# Patient Record
Sex: Male | Born: 1945 | Race: White | Hispanic: No | Marital: Married | Smoking: Former smoker
Health system: Southern US, Community
[De-identification: ages and names within clinical notes are randomized; demographics above are authoritative.]

## PROBLEM LIST (undated history)

## (undated) DIAGNOSIS — I2510 Atherosclerotic heart disease of native coronary artery without angina pectoris: Secondary | ICD-10-CM

## (undated) DIAGNOSIS — E7800 Pure hypercholesterolemia, unspecified: Secondary | ICD-10-CM

## (undated) DIAGNOSIS — R42 Dizziness and giddiness: Secondary | ICD-10-CM

## (undated) DIAGNOSIS — I214 Non-ST elevation (NSTEMI) myocardial infarction: Secondary | ICD-10-CM

## (undated) DIAGNOSIS — C4490 Unspecified malignant neoplasm of skin, unspecified: Secondary | ICD-10-CM

## (undated) DIAGNOSIS — I10 Essential (primary) hypertension: Secondary | ICD-10-CM

## (undated) DIAGNOSIS — G8929 Other chronic pain: Secondary | ICD-10-CM

## (undated) DIAGNOSIS — M545 Low back pain, unspecified: Secondary | ICD-10-CM

## (undated) DIAGNOSIS — E119 Type 2 diabetes mellitus without complications: Secondary | ICD-10-CM

## (undated) DIAGNOSIS — M1990 Unspecified osteoarthritis, unspecified site: Secondary | ICD-10-CM

## (undated) DIAGNOSIS — J189 Pneumonia, unspecified organism: Secondary | ICD-10-CM

## (undated) HISTORY — PX: TONSILLECTOMY: SUR1361

## (undated) HISTORY — PX: BACK SURGERY: SHX140

## (undated) HISTORY — PX: COSMETIC SURGERY: SHX468

## (undated) HISTORY — PX: CORONARY ANGIOPLASTY WITH STENT PLACEMENT: SHX49

## (undated) HISTORY — PX: CATARACT EXTRACTION: SUR2

## (undated) HISTORY — PX: FRACTURE SURGERY: SHX138

## (undated) HISTORY — PX: APPENDECTOMY: SHX54

---

## 1979-09-19 HISTORY — PX: FOREARM FRACTURE SURGERY: SHX649

## 2007-08-19 DIAGNOSIS — I214 Non-ST elevation (NSTEMI) myocardial infarction: Secondary | ICD-10-CM

## 2007-08-19 HISTORY — DX: Non-ST elevation (NSTEMI) myocardial infarction: I21.4

## 2009-09-18 HISTORY — PX: PARATHYROIDECTOMY: SHX19

## 2009-09-18 HISTORY — PX: CARDIAC CATHETERIZATION: SHX172

## 2012-09-18 DIAGNOSIS — J189 Pneumonia, unspecified organism: Secondary | ICD-10-CM

## 2012-09-18 HISTORY — DX: Pneumonia, unspecified organism: J18.9

## 2013-09-21 DIAGNOSIS — J180 Bronchopneumonia, unspecified organism: Secondary | ICD-10-CM | POA: Diagnosis not present

## 2015-04-19 ENCOUNTER — Inpatient Hospital Stay (HOSPITAL_COMMUNITY)
Admission: EM | Admit: 2015-04-19 | Discharge: 2015-04-20 | DRG: 247 | Disposition: A | Discharge status: Home / Self Care | Payer: Medicare Other | Attending: Cardiovascular Disease | Admitting: Cardiovascular Disease

## 2015-04-19 ENCOUNTER — Emergency Department (HOSPITAL_COMMUNITY): Payer: Medicare Other

## 2015-04-19 ENCOUNTER — Encounter (HOSPITAL_COMMUNITY): Payer: Self-pay

## 2015-04-19 ENCOUNTER — Encounter (HOSPITAL_COMMUNITY)
Admission: EM | Disposition: A | Discharge status: Home / Self Care | Payer: Medicare Other | Source: Home / Self Care | Attending: Cardiovascular Disease

## 2015-04-19 DIAGNOSIS — E119 Type 2 diabetes mellitus without complications: Secondary | ICD-10-CM | POA: Diagnosis present

## 2015-04-19 DIAGNOSIS — I10 Essential (primary) hypertension: Secondary | ICD-10-CM | POA: Diagnosis present

## 2015-04-19 DIAGNOSIS — I2510 Atherosclerotic heart disease of native coronary artery without angina pectoris: Secondary | ICD-10-CM | POA: Diagnosis not present

## 2015-04-19 DIAGNOSIS — E785 Hyperlipidemia, unspecified: Secondary | ICD-10-CM | POA: Diagnosis present

## 2015-04-19 DIAGNOSIS — I214 Non-ST elevation (NSTEMI) myocardial infarction: Secondary | ICD-10-CM | POA: Diagnosis not present

## 2015-04-19 DIAGNOSIS — Z7982 Long term (current) use of aspirin: Secondary | ICD-10-CM

## 2015-04-19 DIAGNOSIS — I252 Old myocardial infarction: Secondary | ICD-10-CM

## 2015-04-19 DIAGNOSIS — Z955 Presence of coronary angioplasty implant and graft: Secondary | ICD-10-CM

## 2015-04-19 DIAGNOSIS — I25110 Atherosclerotic heart disease of native coronary artery with unstable angina pectoris: Secondary | ICD-10-CM | POA: Diagnosis present

## 2015-04-19 DIAGNOSIS — T82858A Stenosis of vascular prosthetic devices, implants and grafts, initial encounter: Secondary | ICD-10-CM | POA: Diagnosis present

## 2015-04-19 DIAGNOSIS — R079 Chest pain, unspecified: Secondary | ICD-10-CM

## 2015-04-19 DIAGNOSIS — Z87891 Personal history of nicotine dependence: Secondary | ICD-10-CM | POA: Diagnosis not present

## 2015-04-19 DIAGNOSIS — R0789 Other chest pain: Secondary | ICD-10-CM | POA: Diagnosis not present

## 2015-04-19 HISTORY — DX: Non-ST elevation (NSTEMI) myocardial infarction: I21.4

## 2015-04-19 HISTORY — DX: Essential (primary) hypertension: I10

## 2015-04-19 HISTORY — DX: Atherosclerotic heart disease of native coronary artery without angina pectoris: I25.10

## 2015-04-19 HISTORY — DX: Low back pain: M54.5

## 2015-04-19 HISTORY — DX: Unspecified osteoarthritis, unspecified site: M19.90

## 2015-04-19 HISTORY — DX: Dizziness and giddiness: R42

## 2015-04-19 HISTORY — DX: Pneumonia, unspecified organism: J18.9

## 2015-04-19 HISTORY — DX: Type 2 diabetes mellitus without complications: E11.9

## 2015-04-19 HISTORY — DX: Unspecified malignant neoplasm of skin, unspecified: C44.90

## 2015-04-19 HISTORY — DX: Pure hypercholesterolemia, unspecified: E78.00

## 2015-04-19 HISTORY — DX: Low back pain, unspecified: M54.50

## 2015-04-19 HISTORY — DX: Other chronic pain: G89.29

## 2015-04-19 HISTORY — PX: CARDIAC CATHETERIZATION: SHX172

## 2015-04-19 LAB — BASIC METABOLIC PANEL
Anion gap: 9 (ref 5–15)
BUN: 20 mg/dL (ref 6–20)
CO2: 28 mmol/L (ref 22–32)
Calcium: 9 mg/dL (ref 8.9–10.3)
Chloride: 103 mmol/L (ref 101–111)
Creatinine, Ser: 1.15 mg/dL (ref 0.61–1.24)
GFR calc Af Amer: >60 mL/min (ref >60)
GFR calc non Af Amer: >60 mL/min (ref >60)
Glucose, Bld: 120 mg/dL — ABNORMAL HIGH (ref 65–99)
POTASSIUM: 3.7 mmol/L (ref 3.5–5.1)
SODIUM: 140 mmol/L (ref 135–145)

## 2015-04-19 LAB — CBC
HCT: 38.9 % — ABNORMAL LOW (ref 39.0–52.0)
Hemoglobin: 13 g/dL (ref 13.0–17.0)
MCH: 30.8 pg (ref 26.0–34.0)
MCHC: 33.4 g/dL (ref 30.0–36.0)
MCV: 92.2 fL (ref 78.0–100.0)
Platelets: 209 10*3/uL (ref 150–400)
RBC: 4.22 MIL/uL (ref 4.22–5.81)
RDW: 13.5 % (ref 11.5–15.5)
WBC: 6.2 10*3/uL (ref 4.0–10.5)

## 2015-04-19 LAB — TROPONIN I
Troponin I: 1.43 ng/mL (ref <0.031)
Troponin I: 2.32 ng/mL (ref <0.031)

## 2015-04-19 LAB — GLUCOSE, CAPILLARY
Glucose-Capillary: 99 mg/dL (ref 65–99)
Glucose-Capillary: 147 mg/dL — ABNORMAL HIGH (ref 65–99)

## 2015-04-19 LAB — PROTIME-INR
INR: 1 (ref 0.00–1.49)
Prothrombin Time: 13.4 seconds (ref 11.6–15.2)

## 2015-04-19 LAB — I-STAT TROPONIN, ED: Troponin i, poc: 0.51 ng/mL (ref 0.00–0.08)

## 2015-04-19 SURGERY — LEFT HEART CATH AND CORONARY ANGIOGRAPHY
Anesthesia: LOCAL

## 2015-04-19 MED ORDER — NITROGLYCERIN 2 % TD OINT
1.0000 [in_us] | TOPICAL_OINTMENT | Freq: Four times a day (QID) | TRANSDERMAL | Status: DC
  Administered 2015-04-19: 1 [in_us] via TOPICAL
  Filled 2015-04-19: qty 1

## 2015-04-19 MED ORDER — FENTANYL CITRATE (PF) 100 MCG/2ML IJ SOLN
INTRAMUSCULAR | Status: AC
  Filled 2015-04-19: qty 4

## 2015-04-19 MED ORDER — SODIUM CHLORIDE 0.9 % WEIGHT BASED INFUSION: 3.0000 mL/kg/h | INTRAVENOUS | Status: AC

## 2015-04-19 MED ORDER — GABAPENTIN 600 MG PO TABS
600.0000 mg | ORAL_TABLET | Freq: Three times a day (TID) | ORAL | Status: DC
  Administered 2015-04-19 – 2015-04-20 (×3): 600 mg via ORAL
  Filled 2015-04-19 (×6): qty 1

## 2015-04-19 MED ORDER — SODIUM CHLORIDE 0.9 % IV SOLN
250.0000 mL | INTRAVENOUS | Status: DC | PRN
Start: 2015-04-19 — End: 2015-04-20

## 2015-04-19 MED ORDER — LIDOCAINE HCL (PF) 1 % IJ SOLN
INTRAMUSCULAR | Status: AC
  Filled 2015-04-19: qty 30

## 2015-04-19 MED ORDER — LIDOCAINE HCL (PF) 1 % IJ SOLN
INTRAMUSCULAR | Status: DC | PRN
  Administered 2015-04-19: 30 mL via INTRADERMAL

## 2015-04-19 MED ORDER — SODIUM CHLORIDE 0.9 % IJ SOLN: 3.0000 mL | INTRAMUSCULAR | Status: DC | PRN

## 2015-04-19 MED ORDER — SODIUM CHLORIDE 0.9 % IJ SOLN
3.0000 mL | Freq: Two times a day (BID) | INTRAMUSCULAR | Status: DC
Start: 2015-04-19 — End: 2015-04-19

## 2015-04-19 MED ORDER — NITROGLYCERIN 1 MG/10 ML FOR IR/CATH LAB
INTRA_ARTERIAL | Status: DC | PRN
  Administered 2015-04-19: 200 ug via INTRACORONARY

## 2015-04-19 MED ORDER — HYDRALAZINE HCL 20 MG/ML IJ SOLN
10.0000 mg | Freq: Four times a day (QID) | INTRAMUSCULAR | Status: DC | PRN
  Administered 2015-04-19: 10 mg via INTRAVENOUS
  Filled 2015-04-19: qty 1

## 2015-04-19 MED ORDER — FENTANYL CITRATE (PF) 100 MCG/2ML IJ SOLN
INTRAMUSCULAR | Status: DC | PRN
  Administered 2015-04-19 (×2): 25 ug via INTRAVENOUS

## 2015-04-19 MED ORDER — MIDAZOLAM HCL 2 MG/2ML IJ SOLN
INTRAMUSCULAR | Status: AC
  Filled 2015-04-19: qty 4

## 2015-04-19 MED ORDER — SODIUM CHLORIDE 0.9 % IJ SOLN: 3.0000 mL | Freq: Two times a day (BID) | INTRAMUSCULAR | Status: DC

## 2015-04-19 MED ORDER — LOSARTAN POTASSIUM 50 MG PO TABS: 100.0000 mg | ORAL_TABLET | Freq: Every day | ORAL | Status: DC

## 2015-04-19 MED ORDER — FISH OIL 1000 MG PO CAPS: 1.0000 | ORAL_CAPSULE | Freq: Every day | ORAL | Status: DC

## 2015-04-19 MED ORDER — MORPHINE SULFATE 2 MG/ML IJ SOLN
2.0000 mg | INTRAMUSCULAR | Status: DC | PRN
  Administered 2015-04-19 (×4): 2 mg via INTRAVENOUS
  Filled 2015-04-19 (×4): qty 1

## 2015-04-19 MED ORDER — TICAGRELOR 90 MG PO TABS
ORAL_TABLET | ORAL | Status: DC | PRN
  Administered 2015-04-19: 180 mg via ORAL

## 2015-04-19 MED ORDER — AMLODIPINE BESYLATE 5 MG PO TABS
5.0000 mg | ORAL_TABLET | Freq: Every day | ORAL | Status: DC
  Administered 2015-04-19 – 2015-04-20 (×2): 5 mg via ORAL
  Filled 2015-04-19 (×2): qty 1

## 2015-04-19 MED ORDER — HEPARIN BOLUS VIA INFUSION
4000.0000 [IU] | Freq: Once | INTRAVENOUS | Status: AC
  Administered 2015-04-19: 4000 [IU] via INTRAVENOUS
  Filled 2015-04-19: qty 4000

## 2015-04-19 MED ORDER — ENSURE ENLIVE PO LIQD
237.0000 mL | Freq: Two times a day (BID) | ORAL | Status: DC
  Administered 2015-04-19: 237 mL via ORAL
  Filled 2015-04-19 (×5): qty 237

## 2015-04-19 MED ORDER — SODIUM CHLORIDE 0.9 % IV SOLN: 250.0000 mL | INTRAVENOUS | Status: DC | PRN

## 2015-04-19 MED ORDER — ROSUVASTATIN CALCIUM 20 MG PO TABS: 20.0000 mg | ORAL_TABLET | Freq: Every evening | ORAL | Status: DC

## 2015-04-19 MED ORDER — MORPHINE SULFATE 4 MG/ML IJ SOLN
4.0000 mg | Freq: Once | INTRAMUSCULAR | Status: AC
  Administered 2015-04-19: 17:00:00 4 mg via INTRAVENOUS
  Filled 2015-04-19: qty 1

## 2015-04-19 MED ORDER — NITROGLYCERIN 0.4 MG/SPRAY TL SOLN
Status: AC
  Filled 2015-04-19: qty 4.9

## 2015-04-19 MED ORDER — ATORVASTATIN CALCIUM 80 MG PO TABS
80.0000 mg | ORAL_TABLET | Freq: Every day | ORAL | Status: DC
  Administered 2015-04-19: 19:00:00 80 mg via ORAL
  Filled 2015-04-19 (×3): qty 1

## 2015-04-19 MED ORDER — HEPARIN (PORCINE) IN NACL 2-0.9 UNIT/ML-% IJ SOLN
INTRAMUSCULAR | Status: AC
  Filled 2015-04-19: qty 1500

## 2015-04-19 MED ORDER — TICAGRELOR 90 MG PO TABS
90.0000 mg | ORAL_TABLET | Freq: Two times a day (BID) | ORAL | Status: DC
  Administered 2015-04-19 – 2015-04-20 (×2): 90 mg via ORAL
  Filled 2015-04-19 (×3): qty 1

## 2015-04-19 MED ORDER — BIVALIRUDIN BOLUS VIA INFUSION - CUPID
INTRAVENOUS | Status: DC | PRN
  Administered 2015-04-19: 60.525 mg via INTRAVENOUS

## 2015-04-19 MED ORDER — NITROGLYCERIN 0.4 MG SL SUBL: 0.4000 mg | SUBLINGUAL_TABLET | SUBLINGUAL | Status: DC | PRN

## 2015-04-19 MED ORDER — ACETAMINOPHEN 325 MG PO TABS: 650.0000 mg | ORAL_TABLET | ORAL | Status: DC | PRN

## 2015-04-19 MED ORDER — SODIUM CHLORIDE 0.9 % IV SOLN: INTRAVENOUS | Status: DC

## 2015-04-19 MED ORDER — ASPIRIN 81 MG PO CHEW
81.0000 mg | CHEWABLE_TABLET | Freq: Every day | ORAL | Status: DC
  Administered 2015-04-20: 10:00:00 81 mg via ORAL
  Filled 2015-04-19: qty 1

## 2015-04-19 MED ORDER — METOPROLOL TARTRATE 25 MG PO TABS
25.0000 mg | ORAL_TABLET | Freq: Every day | ORAL | Status: DC
  Administered 2015-04-19 – 2015-04-20 (×2): 25 mg via ORAL
  Filled 2015-04-19 (×2): qty 1

## 2015-04-19 MED ORDER — SODIUM CHLORIDE 0.9 % IV SOLN
1.0000 mg/kg/h | INTRAVENOUS | Status: AC
  Filled 2015-04-19: qty 250

## 2015-04-19 MED ORDER — NITROGLYCERIN 0.4 MG/SPRAY TL SOLN
Status: DC | PRN
  Administered 2015-04-19: 1 via SUBLINGUAL

## 2015-04-19 MED ORDER — MIDAZOLAM HCL 2 MG/2ML IJ SOLN
INTRAMUSCULAR | Status: DC | PRN
  Administered 2015-04-19: 1 mg via INTRAVENOUS

## 2015-04-19 MED ORDER — ANGIOPLASTY BOOK
Freq: Once | Status: AC
  Administered 2015-04-19: 20:00:00
  Filled 2015-04-19: qty 1

## 2015-04-19 MED ORDER — NITROGLYCERIN 1 MG/10 ML FOR IR/CATH LAB
INTRA_ARTERIAL | Status: AC
  Filled 2015-04-19: qty 10

## 2015-04-19 MED ORDER — TICAGRELOR 90 MG PO TABS
ORAL_TABLET | ORAL | Status: AC
  Filled 2015-04-19: qty 2

## 2015-04-19 MED ORDER — BIVALIRUDIN 250 MG IV SOLR
INTRAVENOUS | Status: AC
  Filled 2015-04-19: qty 250

## 2015-04-19 MED ORDER — ASPIRIN 81 MG PO CHEW: 81.0000 mg | CHEWABLE_TABLET | ORAL | Status: DC

## 2015-04-19 MED ORDER — ACETAMINOPHEN 500 MG PO TABS: 500.0000 mg | ORAL_TABLET | Freq: Two times a day (BID) | ORAL | Status: DC | PRN

## 2015-04-19 MED ORDER — BIVALIRUDIN 250 MG IV SOLR
250.0000 mg | INTRAVENOUS | Status: DC | PRN
  Administered 2015-04-19 (×2): 1.75 mg/kg/h via INTRAVENOUS

## 2015-04-19 MED ORDER — LIDOCAINE HCL (PF) 1 % IJ SOLN
INTRAMUSCULAR | Status: DC | PRN
  Administered 2015-04-19: 12:00:00

## 2015-04-19 MED ORDER — SODIUM CHLORIDE 0.9 % IV SOLN
1.7500 mg/kg/h | INTRAVENOUS | Status: DC
  Filled 2015-04-19: qty 250

## 2015-04-19 MED ORDER — HEPARIN (PORCINE) IN NACL 100-0.45 UNIT/ML-% IJ SOLN
950.0000 [IU]/h | INTRAMUSCULAR | Status: DC
  Administered 2015-04-19: 950 [IU]/h via INTRAVENOUS
  Filled 2015-04-19 (×2): qty 250

## 2015-04-19 MED ORDER — HEPARIN (PORCINE) IN NACL 100-0.45 UNIT/ML-% IJ SOLN
INTRAMUSCULAR | Status: DC | PRN
  Administered 2015-04-19: 900 [IU]/h via INTRAVENOUS

## 2015-04-19 MED ORDER — ONDANSETRON HCL 4 MG/2ML IJ SOLN
4.0000 mg | Freq: Four times a day (QID) | INTRAMUSCULAR | Status: DC | PRN
  Administered 2015-04-19: 17:00:00 4 mg via INTRAVENOUS
  Filled 2015-04-19: qty 2

## 2015-04-19 SURGICAL SUPPLY — 19 items
BALLN EUPHORA RX 2.0X12 (BALLOONS) ×3
BALLN ~~LOC~~ TREK RX 3.25X20 (BALLOONS) ×3 IMPLANT
BALLOON EUPHORA RX 2.0X12 (BALLOONS) ×2 IMPLANT
CATH INFINITI 5FR MULTPACK ANG (CATHETERS) ×3 IMPLANT
CATH OPTITORQUE TIG 4.0 5F (CATHETERS) IMPLANT
CATH VISTA GUIDE 6FR JR4 (CATHETERS) ×3 IMPLANT
GLIDESHEATH SLEND A-KIT 6F 22G (SHEATH) IMPLANT
KIT ENCORE 26 ADVANTAGE (KITS) ×3 IMPLANT
KIT HEART LEFT (KITS) ×3 IMPLANT
PACK CARDIAC CATHETERIZATION (CUSTOM PROCEDURE TRAY) ×3 IMPLANT
SHEATH PINNACLE 5F 10CM (SHEATH) ×3 IMPLANT
SHEATH PINNACLE 6F 10CM (SHEATH) ×3 IMPLANT
STENT XIENCE ALPINE RX 2.75X33 (Permanent Stent) ×3 IMPLANT
STENT XIENCE ALPINE RX 3.0X15 (Permanent Stent) ×3 IMPLANT
SYR MEDRAD MARK V 150ML (SYRINGE) ×3 IMPLANT
TRANSDUCER W/STOPCOCK (MISCELLANEOUS) ×3 IMPLANT
WIRE ASAHI PROWATER 180CM (WIRE) ×3 IMPLANT
WIRE EMERALD 3MM-J .035X150CM (WIRE) ×3 IMPLANT
WIRE HI TORQ VERSACORE-J 145CM (WIRE) IMPLANT

## 2015-04-19 NOTE — Progress Notes (Addendum)
UR COMPLETED  

## 2015-04-19 NOTE — Progress Notes (Signed)
CRITICAL VALUE ALERT  Critical value received: Troponin I  Date of notification:  04/19/2015  Time of notification:  7893  Critical value read back:Yes.    Nurse who received alert:  Dina Rich RN  MD notified (1st page): Almyra Deforest PA  Time of first page: 1500  Responding MD: Almyra Deforest   Time MD responded: [phone]

## 2015-04-19 NOTE — Progress Notes (Signed)
Patient arrived from cath lab approx 1300 with tender right groin site and mild ooze from site.  Pain steadily increased over the next hour and small (6 cm) hematoma noted distal to site.  Dr. Ellyn Hack called and examined patient.  Pain managed with morphine throughout the afternoon and for sheath pull.  Patient resting comfortably at this time with no pain except upon palp.  Hematoma has resolved.

## 2015-04-19 NOTE — Progress Notes (Signed)
ANTICOAGULATION CONSULT NOTE - Initial Consult  Pharmacy Consult for heparin Indication: chest pain/ACS  Allergies  Allergen Reactions  . Penicillins     Patient Measurements: Height: 5\' 8"  (172.7 cm) Weight: 178 lb (80.74 kg) IBW/kg (Calculated) : 68.4 Heparin Dosing Weight: 80.7kg  Vital Signs: Temp: 98.1 F (36.7 C) (08/01 0729) Temp Source: Oral (08/01 0729) BP: 154/81 mmHg (08/01 0915) Pulse Rate: 78 (08/01 0915)  Labs:  Recent Labs  04/19/15 0825  HGB 13.0  HCT 38.9*  PLT 209  CREATININE 1.15    Estimated Creatinine Clearance: 60.3 mL/min (by C-G formula based on Cr of 1.15).   Medical History: Past Medical History  Diagnosis Date  . Diabetes mellitus without complication   . NSTEMI (non-ST elevated myocardial infarction)   . Hypertension   . High cholesterol     Assessment: 82 yom with PMH of NSTEMI and coronary stent replacement to the ED with CP. Pharmacy consulted to dose heparin for ACS/CP (no anticoag pta) and cardiology consulted to evaluate for possible cath. CBC wnl on admit. No bleeding documented.  Goal of Therapy:  Heparin level 0.3-0.7 units/ml Monitor platelets by anticoagulation protocol: Yes   Plan:  Heparin 4000unit bolus Heparin IV @ 950 units/h 6h HL Daily HL/CBC Mon s/sx bleeding  Elicia Lamp, PharmD Clinical Pharmacist Pager [phone] 04/19/2015 9:27 AM

## 2015-04-19 NOTE — H&P (Signed)
Patient ID: Trevor Hood MRN: [id number], DOB/AGE: 24-Oct-1945   Admit date: 04/19/2015   Primary Physician: No primary care provider on file. Primary Cardiologist: New (Dr. Harrington Challenger)  Pt. Profile:  69 year old male with known history of CAD status post prior non-STEMI in December 2008 resulting in PCI + drug-eluting stenting to the distal RCA 2 followed by drug-eluting stent PCI to the circumflex in January 2009. Also with multiple other cardiac risk factors including hypertension, hyperlipidemia and diabetes, presenting with recurrent chest pain/non-STEMI.  Problem List  Past Medical History  Diagnosis Date  . Diabetes mellitus without complication   . NSTEMI (non-ST elevated myocardial infarction)   . Hypertension   . High cholesterol     Past Surgical History  Procedure Laterality Date  . Coronary stent placement      DES x2 to distal RCA 2008; DES to circ 2009     Allergies  Allergies  Allergen Reactions  . Penicillins     HPI The patient is a 69 year old retired male, who presents to the [facility] ED with complaints of chest pain.  He has a known history of CAD. In December 2008 he had a non-STEMI secondary to distal occlusion of the RCA. He underwent PCI plus drug-eluting stenting 2 to the RCA. In January 2009 he underwent PCI of the circumflex also with a drug-eluting stent. All 3 stents were [place] in [place]. He had a repeat heart catheterization in 2011 performed in [place] that showed patent stents. He also has a history of treated hypertension and hyperlipidemia. He has a history of diabetes that is diet controlled. He notes a remote history of tobacco abuse, and quit in 1985. He also reports a history of bilateral carotid artery disease with 100% occlusion of the right and at least 50% on the left. This has been followed by the [facility] in [place].  He reports full medication compliance with all of his prescribed meds.  He presented to the [facility] ED earlier today after developing nocturnal chest discomfort awakening him from his sleep. Symptoms began at approximately 3 AM this morning. He noted left sided arm pain radiating to his sternum. The discomfort felt pressure-like. He denies any associated dyspnea. His chest pain is very similar to his previous angina. He took sublingual nitroglycerin at home with some relief and his wife called EMS. He was transported to the ED for further evaluation. In route, he was given additional sublingual nitroglycerin and a nitroglycerin patch was placed. EKG shows NSR w/o ischemia. No baseline EKGs for comparison. POC troponin in ED elevated at 0.51. CXR reviewed. Minimal infiltrate in RUL cannot be excluded, otherwise no other abnormality. CBC and BMP unremarkable. IV heparin and IV nitro were both initiated in the ED. He is now chest pain-free.   His wife and one of his daughters, who is a Therapist, sports with [employer], are present by his bedside. He has 5  daughters. No sons. He is retired from [employer] and lived in multiple states including [place], [place] and now [place]. He also served in [employer].    Home Medications  Prior to Admission medications   Medication Sig Start Date End Date Taking? Authorizing Provider  acetaminophen (TYLENOL) 500 MG tablet Take 500 mg by mouth 2 (two) times daily as needed for moderate pain.   Yes Historical Provider, MD  amLODipine (NORVASC) 5 MG tablet Take 5 mg by mouth daily.   Yes Historical Provider, MD  aspirin 325 MG tablet Take 325 mg by mouth daily.   Yes Historical Provider, MD  gabapentin (NEURONTIN) 600 MG tablet Take 600 mg by mouth 3 (three) times daily.   Yes Historical Provider, MD  losartan (COZAAR) 100 MG tablet Take 100 mg by mouth daily.   Yes Historical Provider, MD  metoprolol (LOPRESSOR) 50 MG tablet Take 25 mg by mouth daily.   Yes Historical Provider, MD  nitroGLYCERIN (NITROSTAT) 0.4 MG SL tablet Place 0.4 mg under the tongue  every 5 (five) minutes as needed for chest pain.   Yes Historical Provider, MD  Omega-3 Fatty Acids (FISH OIL) 1000 MG CAPS Take 1 capsule by mouth daily.   Yes Historical Provider, MD  rosuvastatin (CRESTOR) 20 MG tablet Take 20 mg by mouth every evening.   Yes Historical Provider, MD  traMADol (ULTRAM) 50 MG tablet Take 50 mg by mouth 3 (three) times daily as needed.   Yes Historical Provider, MD    Family History  Family History  Problem Relation Age of Onset  . Hypertension Mother     Social History  History   Social History  . Marital Status: Married    Spouse Name: N/A  . Number of Children: N/A  . Years of Education: N/A   Occupational History  . Not on file.   Social History Main Topics  . Smoking status: Former Research scientist (life sciences)  . Smokeless tobacco: Not on file  . Alcohol Use: 0.6 oz/week    1 Glasses of wine per week  . Drug Use: Not on file  . Sexual Activity: Not on file   Other Topics Concern  . Not on file   Social History Narrative  . No narrative on file     Review of Systems General:  No chills, fever, night sweats or weight changes.  Cardiovascular:  No chest pain, dyspnea on exertion, edema, orthopnea, palpitations, paroxysmal nocturnal dyspnea. Dermatological: No rash, lesions/masses Respiratory: No cough, dyspnea Urologic: No hematuria, dysuria Abdominal:   No nausea, vomiting, diarrhea, bright red blood per rectum, melena, or hematemesis Neurologic:  No visual changes, wkns, changes in mental status. All other systems reviewed and are otherwise negative except as noted above.  Physical Exam  Blood pressure 157/81, pulse 75, temperature 98.1 F (36.7 C), temperature source Oral, resp. rate 18, height 5\' 8"  (1.727 m), weight 178 lb (80.74 kg), SpO2 95 %.  General: Pleasant, NAD Psych: Normal affect. Neuro: Alert and oriented X 3. Moves all extremities spontaneously. HEENT: Normal  Neck: Supple without bruits or JVD. Lungs:  Resp regular and  unlabored, CTA. Heart: RRR, 1/6 SM at the RUSB. Abdomen: Soft, non-tender, non-distended, BS + x 4.  Extremities: No clubbing, cyanosis or edema. DP/PT/Radials 2+ and equal bilaterally.  Labs  Troponin [facility] of Care Test)  Recent Labs  04/19/15 0833  TROPIPOC 0.51*   No results for input(s): CKTOTAL, CKMB, TROPONINI in the last 72 hours. Lab Results  Component Value Date   WBC 6.2 04/19/2015   HGB 13.0 04/19/2015   HCT 38.9* 04/19/2015   MCV 92.2 04/19/2015   PLT 209 04/19/2015     Recent Labs Lab 04/19/15 0825  NA 140  K 3.7  CL 103  CO2 28  BUN 20  CREATININE 1.15  CALCIUM 9.0  GLUCOSE 120*   No results found for: CHOL, HDL, LDLCALC, TRIG No results found for: DDIMER   Radiology/Studies  Dg Chest 2 View  04/19/2015   CLINICAL DATA:  Left chest pain.  EXAM: CHEST  2 VIEW  COMPARISON:  None.  FINDINGS: Mediastinum and hilar structures normal. Minimal infiltrate right upper lobe cannot be excluded. Mild bibasilar subsegmental atelectasis. Heart size normal. No pleural effusion or pneumothorax. No acute bony abnormality.  IMPRESSION: 1.  Minimal infiltrate right upper lobe cannot be excluded.  2.  Mild bibasilar subsegmental atelectasis.   Electronically Signed   By: Marcello Moores  Register   On: 04/19/2015 08:03    ECG  Sinus rhythm without ischemia  ASSESSMENT AND PLAN  1. CAD/unstable angina/non-STEMI: Patient has known coronary artery disease and prior history of MI status post drug-eluting stenting to his RCA and circumflex arteries in 2008 and 2009. He now presents with chest pain very concerning for unstable angina. Initial point of care troponin is elevated at 0.51. He is currently chest pain-free on IV heparin and IV nitroglycerin. He has been NPO since last night. Given his history, recurrent angina and abnormal point of care troponin, would recommend left heart catheterization. Renal function is normal. Will chheck a stat INR and plan for left heart cath today +/-  PCI. Continue his home Metroprolol and Crestor. Will hold losartan until after his cath. Check 2D echo. Will continue to cycle cardiac enzymes to assess trend.  2. HTN: moderately elevated in the 808U systolic. Continue BB.  3. Hyperlipidemia: Continue Crestor  4. Bilateral Carotid Artery Disease: reports 100% on the right and 50% stenosis on the left. Followed at [facility] in [place]   5. DM: pt reports that this is been diet-controlled. Will check a hemoglobin A1c.    SignedLyda Jester, PA-C 04/19/2015, 10:39 AM   Agree with note written by Ellen Henri  [facility]  Patient with unstable angina. He has known CAD status post stenting of his right and circumflex coronary arteries in the past with positive risk factors. His point-of-care enzymes were mildly elevated. His EKG showed no acute changes. He was placed on IV heparin. He presents now for diagnostic coronary arteriography to define his anatomy.  Quay Burow 04/19/2015 11:25 AM

## 2015-04-19 NOTE — Progress Notes (Signed)
Site area: right groin  Site Prior to Removal:  Level 0  Pressure Applied For 20 MINUTES    Minutes Beginning at 1640  Manual:   Yes.    Patient Status During Pull:  stable  Post Pull Groin Site:  Level 0  Post Pull Instructions Given:  Yes.    Post Pull Pulses Present:  Yes.    Dressing Applied:  Yes.    Comments:

## 2015-04-19 NOTE — ED Notes (Signed)
Pt reports left sided chest pain that radiates down left arm that began at 0300 this morning.  Pt took two nitro at 0300 and reports some relief.  When he woke up at 0600 the pain had returned.  Pt given 3 nitro and 324mg  of ASA by EMS PTA.  The pain is described as sharp and is reproduced with deep breathing and palpation.

## 2015-04-19 NOTE — ED Provider Notes (Addendum)
CSN: [id number]     Arrival date & time 04/19/15  0721 History   First MD Initiated Contact with Patient 04/19/15 [phone]     Chief Complaint  Patient presents with  . Chest Pain     (Consider location/radiation/quality/duration/timing/severity/associated sxs/prior Treatment) HPI Complains of anterior chest pain rating to left arm awakened from sleep 3 AM today. He treated himself with 2 sublingual nitroglycerins and aspirin 325 mg with relief .pain recurred 6:30 AM today. He called EMS. EMS administered an additional 2 sublingual nitroglycerin's. He is presently pain-free associated symptoms include shortness of breath. No nausea no sweatiness. Past Medical History  Diagnosis Date  . Diabetes mellitus without complication   . NSTEMI (non-ST elevated myocardial infarction)   . Hypertension   . High cholesterol    Past Surgical History  Procedure Laterality Date  . Coronary stent placement     History reviewed. No pertinent family history. History  Substance Use Topics  . Smoking status: Former Research scientist (life sciences)  . Smokeless tobacco: Not on file  . Alcohol Use: 0.6 oz/week    1 Glasses of wine per week   no illicit drug use  Review of Systems  Constitutional: Negative.   HENT: Negative.   Respiratory: Positive for shortness of breath.   Cardiovascular: Positive for chest pain.  Gastrointestinal: Negative.   Musculoskeletal: Negative.   Skin: Negative.   Neurological: Negative.   Psychiatric/Behavioral: Negative.   All other systems reviewed and are negative.     Allergies  Penicillins  Home Medications   Prior to Admission medications   Not on File   Ht 5\' 8"  (1.727 m)  Wt 178 lb (80.74 kg)  BMI 27.07 kg/m2  SpO2 97% Physical Exam  Constitutional: He appears well-developed and well-nourished.  HENT:  Head: Normocephalic and atraumatic.  Eyes: Conjunctivae are normal. Pupils are equal, round, and reactive to light.  Neck: Neck supple. No tracheal deviation present. No  thyromegaly present.  Cardiovascular: Normal rate and regular rhythm.   No murmur heard. Pulmonary/Chest: Effort normal and breath sounds normal.  Abdominal: Soft. Bowel sounds are normal. He exhibits no distension. There is no tenderness.  Musculoskeletal: Normal range of motion. He exhibits no edema or tenderness.  Neurological: He is alert. Coordination normal.  Skin: Skin is warm and dry. No rash noted.  Psychiatric: He has a normal mood and affect.  Nursing note and vitals reviewed.   ED Course  Procedures (including critical care time) Labs Review Labs Reviewed  BASIC METABOLIC PANEL  CBC    Imaging Review No results found.   EKG Interpretation None      Date: 04/19/2015  Rate: 80  Rhythm: normal sinus rhythm  QRS Axis: normal  Intervals: normal  ST/T Wave abnormalities: normal  Conduction Disutrbances: none  Narrative Interpretation: unremarkable   9:05 AM patient asymptomatic pain-free; Chest x-ray viewed by me Results for orders placed or performed during the hospital encounter of 27/03/50  Basic metabolic panel  Result Value Ref Range   Sodium 140 135 - 145 mmol/L   Potassium 3.7 3.5 - 5.1 mmol/L   Chloride 103 101 - 111 mmol/L   CO2 28 22 - 32 mmol/L   Glucose, Bld 120 (H) 65 - 99 mg/dL   BUN 20 6 - 20 mg/dL   Creatinine, Ser 1.15 0.61 - 1.24 mg/dL   Calcium 9.0 8.9 - 10.3 mg/dL   GFR calc non Af Amer >60 >60 mL/min   GFR calc Af Amer >60 >60 mL/min  Anion gap 9 5 - 15  CBC  Result Value Ref Range   WBC 6.2 4.0 - 10.5 K/uL   RBC 4.22 4.22 - 5.81 MIL/uL   Hemoglobin 13.0 13.0 - 17.0 g/dL   HCT 38.9 (L) 39.0 - 52.0 %   MCV 92.2 78.0 - 100.0 fL   MCH 30.8 26.0 - 34.0 pg   MCHC 33.4 30.0 - 36.0 g/dL   RDW 13.5 11.5 - 15.5 %   Platelets 209 150 - 400 K/uL  I-stat troponin, ED  Result Value Ref Range   Troponin i, poc 0.51 (HH) 0.00 - 0.08 ng/mL   Comment NOTIFIED PHYSICIAN    Comment 3           Dg Chest 2 View  04/19/2015   CLINICAL DATA:   Left chest pain.  EXAM: CHEST  2 VIEW  COMPARISON:  None.  FINDINGS: Mediastinum and hilar structures normal. Minimal infiltrate right upper lobe cannot be excluded. Mild bibasilar subsegmental atelectasis. Heart size normal. No pleural effusion or pneumothorax. No acute bony abnormality.  IMPRESSION: 1.  Minimal infiltrate right upper lobe cannot be excluded.  2.  Mild bibasilar subsegmental atelectasis.   Electronically Signed   By: Marcello Moores  Register   On: 04/19/2015 08:03    MDM  Heparin per pharmacy consult ordered I spoke with cardiology service who will come to evaluate patient for admission and cardiac catheterization Final diagnoses:  None   Dx NSTEMI   CRITICAL CARE Performed by: Orlie Dakin Total critical care time: 30 minute Critical care time was exclusive of separately billable procedures and treating other patients. Critical care was necessary to treat or prevent imminent or life-threatening deterioration. Critical care was time spent personally by me on the following activities: development of treatment plan with patient and/or surrogate as well as nursing, discussions with consultants, evaluation of patient's response to treatment, examination of patient, obtaining history from patient or surrogate, ordering and performing treatments and interventions, ordering and review of laboratory studies, ordering and review of radiographic studies, pulse oximetry and re-evaluation of patient's condition. Orlie Dakin, MD 04/19/15 [place], MD 04/19/15 [phone]

## 2015-04-19 NOTE — ED Notes (Signed)
Heparin dose verified by Almedia Balls RN

## 2015-04-19 NOTE — Progress Notes (Addendum)
CM spoke with pt regarding Brilinta and provided pt with booklet with 30- day free card enclosed. CM explained usage of card and pt/wife verbally stated understanding of card usage. [id number]  called per CM to confirm medication is in stock, pt/wife made aware.Pt 's medical insurance is covered through the [place].  Pt will need d/c prescriptions faxed to [phone] (attn: DrSekhon ) for rewrite and for dispensing of medication through [facility]. CM to f/u with disposition needs. Whitman Hero RN,BSN,CM [phone]

## 2015-04-20 ENCOUNTER — Inpatient Hospital Stay (HOSPITAL_COMMUNITY): Payer: Medicare Other

## 2015-04-20 ENCOUNTER — Telehealth: Payer: Self-pay | Admitting: Physician Assistant

## 2015-04-20 DIAGNOSIS — I214 Non-ST elevation (NSTEMI) myocardial infarction: Principal | ICD-10-CM

## 2015-04-20 DIAGNOSIS — R079 Chest pain, unspecified: Secondary | ICD-10-CM

## 2015-04-20 LAB — COMPREHENSIVE METABOLIC PANEL
ALT: 22 U/L (ref 17–63)
ANION GAP: 8 (ref 5–15)
AST: 26 U/L (ref 15–41)
Albumin: 3.4 g/dL — ABNORMAL LOW (ref 3.5–5.0)
Alkaline Phosphatase: 57 U/L (ref 38–126)
BILIRUBIN TOTAL: 0.6 mg/dL (ref 0.3–1.2)
BUN: 18 mg/dL (ref 6–20)
CO2: 30 mmol/L (ref 22–32)
Calcium: 9 mg/dL (ref 8.9–10.3)
Chloride: 101 mmol/L (ref 101–111)
Creatinine, Ser: 1.07 mg/dL (ref 0.61–1.24)
GFR calc Af Amer: >60 mL/min (ref >60)
GFR calc non Af Amer: >60 mL/min (ref >60)
Glucose, Bld: 128 mg/dL — ABNORMAL HIGH (ref 65–99)
POTASSIUM: 4.7 mmol/L (ref 3.5–5.1)
SODIUM: 139 mmol/L (ref 135–145)
Total Protein: 5.9 g/dL — ABNORMAL LOW (ref 6.5–8.1)

## 2015-04-20 LAB — CBC
HEMATOCRIT: 39.1 % (ref 39.0–52.0)
Hemoglobin: 13.1 g/dL (ref 13.0–17.0)
MCH: 31.1 pg (ref 26.0–34.0)
MCHC: 33.5 g/dL (ref 30.0–36.0)
MCV: 92.9 fL (ref 78.0–100.0)
PLATELETS: 219 10*3/uL (ref 150–400)
RBC: 4.21 MIL/uL — ABNORMAL LOW (ref 4.22–5.81)
RDW: 13.8 % (ref 11.5–15.5)
WBC: 7.8 10*3/uL (ref 4.0–10.5)

## 2015-04-20 LAB — GLUCOSE, CAPILLARY
GLUCOSE-CAPILLARY: 126 mg/dL — AB (ref 65–99)
GLUCOSE-CAPILLARY: 119 mg/dL — AB (ref 65–99)

## 2015-04-20 LAB — TROPONIN I
Troponin I: 2.27 ng/mL (ref <0.031)
Troponin I: 1.79 ng/mL (ref <0.031)

## 2015-04-20 LAB — POCT ACTIVATED CLOTTING TIME: Activated Clotting Time: 442 seconds

## 2015-04-20 LAB — HEMOGLOBIN A1C
Hgb A1c MFr Bld: 6 % — ABNORMAL HIGH (ref 4.8–5.6)
Mean Plasma Glucose: 126 mg/dL

## 2015-04-20 MED ORDER — TICAGRELOR 90 MG PO TABS: 90.0000 mg | ORAL_TABLET | Freq: Two times a day (BID) | ORAL | Status: AC

## 2015-04-20 MED ORDER — ASPIRIN 81 MG PO TABS: 81.0000 mg | ORAL_TABLET | Freq: Every day | ORAL | Status: AC

## 2015-04-20 MED FILL — Heparin Sodium (Porcine) 2 Unit/ML in Sodium Chloride 0.9%: INTRAMUSCULAR | Qty: 500 | Status: AC

## 2015-04-20 NOTE — Progress Notes (Signed)
CARDIAC REHAB PHASE I   PRE:  Rate/Rhythm: 80 SR    BP: sitting 156/58    SaO2:   MODE:  Ambulation: 420 ft   POST:  Rate/Rhythm: 93 SR    BP: sitting 154/65     SaO2:   Pt with groin soreness and limping (he sts due to his back issues). No major c/o. Ed completed with wife present. Interested in [place] and will send referral to [place]. [facility] to [facility]. Pt takes crestor at home, not lipitor.  [phone]   Josephina Shih [place] CES, ACSM 04/20/2015 9:03 AM

## 2015-04-20 NOTE — Discharge Summary (Signed)
Discharge Summary   Patient ID: Trevor Hood,  MRN: [id number], DOB/AGE: [birth year] 69 y.o.  Admit date: 04/19/2015 Discharge date: 04/20/2015  Primary Care Provider: Pcp Not In System Primary Cardiologist: New (Dr. Gwenlyn Found). He would establish care in [place] after TCM appointment.   Discharge Diagnoses Active Problems:   NSTEMI (non-ST elevated myocardial infarction)   NSTEMI (non-ST elevation myocardial infarction)   Allergies Allergies  Allergen Reactions  . Penicillins     Procedures  Cath 04/19/15 Conclusion     There is mild to moderate left ventricular systolic dysfunction.  Mid RCA lesion, 95% stenosed.  Mid RCA to Dist RCA lesion, 95% stenosed. There is a 0% residual stenosis post intervention. The lesion was previously treated with a stent (unknown type) .   Coronary Findings    Dominance: Right   Left Circumflex   . Prox Cx to Mid Cx lesion, 0% stenosed. Previously placed Prox Cx to Mid Cx stent (unknown type) is patent.     Right Coronary Artery   . Mid RCA lesion, 95% stenosed.   Marland Kitchen PCI: An unspecified stent was placed.  . There is no residual stenosis post intervention.     . Mid RCA to Dist RCA lesion, 95% stenosed. The lesion was previously treated with a stent (unknown type) .   Marland Kitchen PCI: An unspecified stent was placed.  . There is no residual stenosis post intervention.           Echo 04/20/15 LV EF: 60% -  65%  ------------------------------------------------------------------- Indications:   Chest pain 786.51.  ------------------------------------------------------------------- History:  PMH: NSTEMI. Carotid Artery Stenosis. Risk factors: Hypertension. Diabetes mellitus. Dyslipidemia.  ------------------------------------------------------------------- Study Conclusions  - Left ventricle: The cavity size was normal. Wall thickness was increased in a pattern of mild LVH. Systolic function was  normal. The estimated ejection fraction was in the range of 60% to 65%. Wall motion was normal; there were no regional wall motion abnormalities. Doppler parameters are consistent with abnormal left ventricular relaxation (grade 1 diastolic dysfunction). - Aortic valve: There was very mild stenosis. There was trivial regurgitation. Valve area (Vmax): 1.59 cm^2.   History of Present Illness  The patient is a 69 year old retired male, who presented 8/1 to the [facility] ED with complaints of chest pain. He has a known history of CAD. In December 2008 he had a non-STEMI secondary to distal occlusion of the RCA. He underwent PCI plus drug-eluting stenting 2 to the RCA. In January 2009 he underwent PCI of the circumflex also with a drug-eluting stent. All 3 stents were [place] in [place]. He had a repeat heart catheterization in 2011 performed in [place] that showed patent stents. He also has a history of treated hypertension and hyperlipidemia. He has a history of diabetes that is diet controlled. He has remote history of tobacco abuse, and quit in 1985. He also reports a history of bilateral carotid artery disease with 100% occlusion of the right and at least 50% on the left. This has been followed by the [facility] in [place]. He reports full medication compliance with all of his prescribed meds. Hx of intolerance to Plavix.   He presented 8/1 to the [facility] ED after developing nocturnal chest discomfort awakening him from his sleep.  He noted left sided arm pain radiating to his sternum. The discomfort felt pressure-like. He denies any associated dyspnea. His chest pain is very similar to his previous angina. He took sublingual nitroglycerin at home with some relief and  his wife called EMS. He was transported to the ED for further evaluation. In route, he was given additional sublingual nitroglycerin and a nitroglycerin patch was placed. EKG shows NSR w/o ischemia. No  baseline EKGs for comparison. POC troponin in ED elevated at 0.51. CXR reviewed. Minimal infiltrate in RUL cannot be excluded, otherwise no other abnormality. CBC and BMP unremarkable. IV heparin and IV nitro were both initiated in the ED. His chest pain improved.   His wife and one of his daughters, who is a Therapist, sports with [employer], are present by his bedside. He has 5 daughters. No sons. He is retired from [employer] and lived in multiple states including [place], [place] and now [place]. He also served in [employer].  Hospital Course  The patient was admitted early AM of 04/19/15 with plan for diagnostic coronary arteriography to define his anatomy same day. He was placed on IV heparin and home dose of Metoprolol and Crestor was continued. Held Losartan. Trop trend 0.51->1.43->2.32->2.27->1.79. Cath showed patent Cx stent, 95% stenosed previous stent RCA (unknwon type) s/p successful PTCA and mild to moderate LV function (45-50%). No further chest pain. Ambulated without angina. Echo showed LV EF of 60-65%, mild LVH, no WM abnormality, grade 1 DD and mild aortic stenosis. HgbA1C 6.0.   He has followed up with [facility] cardiologist in past. No current cardiologist. He will come for TCM appoinment. [facility] He was discharged non ASA and Brilinta. Hx of intolerance to Plavix. He was advice to resume Losartan.   Discharge Vitals Blood pressure 162/72, pulse 75, temperature 98.6 F (37 C), temperature source Oral, resp. rate 19, height 5\' 8"  (1.727 m), weight 183 lb 10.3 oz (83.3 kg), SpO2 93 %.  Filed Weights   04/19/15 0726 04/20/15 0100  Weight: 178 lb (80.74 kg) 183 lb 10.3 oz (83.3 kg)    Labs  CBC  Recent Labs  04/19/15 0825 04/20/15 0152  WBC 6.2 7.8  HGB 13.0 13.1  HCT 38.9* 39.1  MCV 92.2 92.9  PLT 209 563   Basic Metabolic Panel  Recent Labs  04/19/15 0825 04/20/15 0152  NA 140 139  K 3.7 4.7  CL 103 101  CO2 28 30    GLUCOSE 120* 128*  BUN 20 18  CREATININE 1.15 1.07  CALCIUM 9.0 9.0   Liver Function Tests  Recent Labs  04/20/15 0152  AST 26  ALT 22  ALKPHOS 57  BILITOT 0.6  PROT 5.9*  ALBUMIN 3.4*   Cardiac Enzymes  Recent Labs  04/19/15 1930 04/20/15 0152 04/20/15 0800  TROPONINI 2.32* 2.27* 1.79*   Hemoglobin A1C  Recent Labs  04/19/15 1359  HGBA1C 6.0*     Disposition  Pt is being discharged home today in good condition.  Follow-up Plans & Appointments  Follow-up Information    Follow up with Richardson Dopp, PA-C On 04/29/2015.   Specialties:  Physician Assistant, Radiology, Interventional Cardiology   Why:  @9 :30 cardiology   Contact information:   [address] [address] [phone]           Discharge Instructions    Amb Referral to Cardiac Rehabilitation    Complete by:  As directed   Congestive Heart Failure: If diagnosis is Heart Failure, patient MUST meet each of the CMS criteria: 1. Left Ventricular Ejection Fraction </= 35% 2. NYHA class II-IV symptoms despite being on optimal heart failure therapy for at least  6 weeks. 3. Stable = have not had a recent (<6 weeks) or planned (<6 months) major cardiovascular hospitalization or procedure  Program Details: - Physician supervised classes - 1-3 classes per week over a 12-18 week period, generally for a total of 36 sessions  Physician Certification: I certify that the above Cardiac Rehabilitation treatment is medically necessary and is medically approved by me for treatment of this patient. The patient is willing and cooperative, able to ambulate and medically stable to participate in exercise rehabilitation. The participant's progress and Individualized Treatment Plan will be reviewed by the Medical Director, Cardiac Rehab staff and as indicated by the Referring/Ordering Physician.  Diagnosis:   Myocardial Infarction PCI       Diet - low sodium heart healthy    Complete  by:  As directed      Discharge instructions    Complete by:  As directed   No driving for 7 days.  No lifting over 5 lbs for 1 week. No sexual activity for 1 week. Keep procedure site clean & dry. If you notice increased pain, swelling, bleeding or pus, call/return!  You may shower, but no soaking baths/hot tubs/pools for 1 week.     Increase activity slowly    Complete by:  As directed            F/u Labs/Studies: None  Discharge Medications    Medication List    TAKE these medications        acetaminophen 500 MG tablet  Commonly known as:  TYLENOL  Take 500 mg by mouth 2 (two) times daily as needed for moderate pain.     amLODipine 5 MG tablet  Commonly known as:  NORVASC  Take 5 mg by mouth daily.     aspirin 81 MG tablet  Take 1 tablet (81 mg total) by mouth daily.     Fish Oil 1000 MG Caps  Take 1 capsule by mouth daily.     gabapentin 600 MG tablet  Commonly known as:  NEURONTIN  Take 600 mg by mouth 3 (three) times daily.     losartan 100 MG tablet  Commonly known as:  COZAAR  Take 100 mg by mouth daily.     metoprolol 50 MG tablet  Commonly known as:  LOPRESSOR  Take 25 mg by mouth daily.     nitroGLYCERIN 0.4 MG SL tablet  Commonly known as:  NITROSTAT  Place 0.4 mg under the tongue every 5 (five) minutes as needed for chest pain.     rosuvastatin 20 MG tablet  Commonly known as:  CRESTOR  Take 20 mg by mouth every evening.     ticagrelor 90 MG Tabs tablet  Commonly known as:  BRILINTA  Take 1 tablet (90 mg total) by mouth 2 (two) times daily.     traMADol 50 MG tablet  Commonly known as:  ULTRAM  Take 50 mg by mouth 3 (three) times daily as needed.        Duration of Discharge Encounter   Greater than 30 minutes including physician time.  Signed, Zitlali Primm PA-C 04/20/2015, 1:58 PM

## 2015-04-20 NOTE — Telephone Encounter (Signed)
New message      TCM appt on 04-29-15 with Richardson Dopp

## 2015-04-20 NOTE — Progress Notes (Signed)
Patient Name: Trevor Hood Date of Encounter: 04/20/2015   SUBJECTIVE  Feels good. Denies chest pain, sob or palpitation.   CURRENT MEDS . amLODipine  5 mg Oral Daily  . aspirin  81 mg Oral Daily  . atorvastatin  80 mg Oral q1800  . feeding supplement (ENSURE ENLIVE)  237 mL Oral BID BM  . gabapentin  600 mg Oral TID  . [START ON 04/21/2015] losartan  100 mg Oral Daily  . metoprolol  25 mg Oral Daily  . sodium chloride  3 mL Intravenous Q12H  . ticagrelor  90 mg Oral BID    OBJECTIVE  Filed Vitals:   04/19/15 2100 04/19/15 2326 04/20/15 0100 04/20/15 0634  BP: 139/59 133/62  151/76  Pulse:  74  80  Temp:  98.6 F (37 C)  97.8 F (36.6 C)  TempSrc:  Oral  Axillary  Resp: 13 16  20   Height:      Weight:   183 lb 10.3 oz (83.3 kg)   SpO2: 96% 95%  96%    Intake/Output Summary (Last 24 hours) at 04/20/15 0712 Last data filed at 04/20/15 0634  Gross per 24 hour  Intake   1568 ml  Output   2050 ml  Net   -482 ml   Filed Weights   04/19/15 0726 04/20/15 0100  Weight: 178 lb (80.74 kg) 183 lb 10.3 oz (83.3 kg)    PHYSICAL EXAM  General: Pleasant, NAD. Neuro: Alert and oriented X 3. Moves all extremities spontaneously. Psych: Normal affect. HEENT:  Normal  Neck: Supple without bruits or JVD. Lungs:  Resp regular and unlabored, CTA. Heart: RRR no s3, s4, or murmurs. Abdomen: Soft, non-tender, non-distended, BS + x 4.  Extremities: No clubbing, cyanosis or edema. DP/PT/Radials 2+ and equal bilaterally. R groin cath site without erythema, hematoma or bruit.   Accessory Clinical Findings  CBC  Recent Labs  04/19/15 0825 04/20/15 0152  WBC 6.2 7.8  HGB 13.0 13.1  HCT 38.9* 39.1  MCV 92.2 92.9  PLT 209 833   Basic Metabolic Panel  Recent Labs  04/19/15 0825 04/20/15 0152  NA 140 139  K 3.7 4.7  CL 103 101  CO2 28 30  GLUCOSE 120* 128*  BUN 20 18  CREATININE 1.15 1.07  CALCIUM 9.0 9.0   Liver Function Tests  Recent Labs  04/20/15 0152    AST 26  ALT 22  ALKPHOS 57  BILITOT 0.6  PROT 5.9*  ALBUMIN 3.4*   No results for input(s): LIPASE, AMYLASE in the last 72 hours. Cardiac Enzymes  Recent Labs  04/19/15 1359 04/19/15 1930 04/20/15 0152  TROPONINI 1.43* 2.32* 2.27*    TELE  NSR  Radiology/Studies  Dg Chest 2 View  04/19/2015   CLINICAL DATA:  Left chest pain.  EXAM: CHEST  2 VIEW  COMPARISON:  None.  FINDINGS: Mediastinum and hilar structures normal. Minimal infiltrate right upper lobe cannot be excluded. Mild bibasilar subsegmental atelectasis. Heart size normal. No pleural effusion or pneumothorax. No acute bony abnormality.  IMPRESSION: 1.  Minimal infiltrate right upper lobe cannot be excluded.  2.  Mild bibasilar subsegmental atelectasis.   Electronically Signed   By: Trevor Hood  Register   On: 04/19/2015 08:03   Cath 04/19/15 Conclusion     There is mild to moderate left ventricular systolic dysfunction.  Mid RCA lesion, 95% stenosed.  Mid RCA to Dist RCA lesion, 95% stenosed. There is a 0% residual stenosis post intervention. The lesion was  previously treated with a stent (unknown type) .   Coronary Findings    Dominance: Right   Left Circumflex   . Prox Cx to Mid Cx lesion, 0% stenosed. Previously placed Prox Cx to Mid Cx stent (unknown type) is patent.     Right Coronary Artery   . Mid RCA lesion, 95% stenosed.   Trevor Hood PCI: An unspecified stent was placed.  . There is no residual stenosis post intervention.     . Mid RCA to Dist RCA lesion, 95% stenosed. The lesion was previously treated with a stent (unknown type) .   Trevor Hood PCI: An unspecified stent was placed.  . There is no residual stenosis post intervention.         ASSESSMENT AND PLAN   1. NSTEMI (non-ST elevated myocardial infarction)   - Cath showed patent Cx stent, 95% stenosed previous stent RCA (unknwon type) s/p successful PTCA and mild to moderate LV function (45-50%).  Echo pending.  - Continue Metoprolol, Lipitor, ASA and  Brilinta. Will restart losartan tomorrow  2. HTN - Continue BB and Norvasc - SBP in 150s. Consider increasing Norvasc   3. HL - AT home on Crestor 20mg . He said that he took lipitor in past, but it did not lower his cholesterol. Now on lipitor. Will resume Crestor on discharge.   4. DM - Diet controlled -HgbA1C pending  5. Bilateral Carotid Artery Disease - reports 100% on the right and 50% stenosis on the left. Followed at [facility] in [place], [place] PA-C Pager [phone]  Patient seen and examined and history reviewed. Agree with above findings and plan. Doing well post PCI. Ambulating without angina. Now on ASA and Brilinta with history of intolerance to Plavix. Stable for DC today. Echo currently being done.   Trevor Hood, [place] 04/20/2015 9:29 AM

## 2015-04-20 NOTE — Discharge Instructions (Signed)
Myocardial Infarction °A myocardial infarction (MI) is damage to the heart that is not reversible. It is also called a heart attack. An MI usually occurs when a heart (coronary) artery becomes blocked or narrowed. This cuts off the blood supply to the heart. When one or more of the heart (coronary) arteries becomes blocked, that area of the heart begins to die. This causes pain felt during an MI.  °If you think you might be having an MI, call your local emergency services immediately (911 in U.S[place] It is recommended that you chew and swallow 3 non-enteric coated baby aspirin if you do not have an aspirin allergy. Do not drive yourself to the hospital or wait to see if your symptoms go away. The sooner MI is treated, the greater the amount of heart muscle saved. Time is muscle. It can save your life. °CAUSES  °An MI can occur from: °· A gradual buildup of a fatty substance called plaque. When plaque builds up in the arteries, this condition is called atherosclerosis. This buildup can block or reduce the blood supply to the heart artery(s). °· A sudden plaque rupture within a heart artery that causes a blood clot (thrombus). A blood clot can block the heart artery which does not allow blood flow to the heart. °· A severe tightening (spasm) of the heart artery. This is a less common cause of a heart attack. When a heart artery spasms, it cuts off blood flow through the artery. Spasms can occur in heart arteries that do not have atherosclerosis. °RISK FACTORS °People at risk for an MI usually have one or more risk factors, such as: °· High blood pressure. °· High cholesterol. °· Smoking. °· Gender. Men have a higher heart attack risk. °· Overweight/obesity. °· Age. °· Family history. °· Lack of exercise. °· Diabetes. °· Stress. °· Excessive alcohol use. °· Street drug use (cocaine and methamphetamines). °SYMPTOMS  °MI symptoms can vary, such as: °· In both men and women, MI symptoms can include the following: °· Chest  pain. The chest pain may feel like a crushing, squeezing, or "pressure" type feeling. MI pain can be "referred," meaning pain can be caused in one part of the body but felt in another part of the body. Referred MI pain may occur in the left arm, neck, or jaw. Pain may even be felt in the right arm. °· Shortness of breath (dyspnea). °· Heartburn or indigestion with or without vomiting, shortness of breath, or sweating (diaphoresis). °· Sudden, cold sweats. °· Sudden lightheadedness. °· Upper back pain. °· Women can have unique MI symptoms, such as: °· Unexplained feelings of nervousness or anxiety. °· Discomfort between the shoulder blades (scapula) or upper back. °· Tingling in the hands and arms. °· In elderly people (regardless of gender), MI symptoms can be subtle, such as: °· Sweating (diaphoresis). °· Shortness of breath (dyspnea). °· General tiredness (fatigue) or not feeling well (malaise). °DIAGNOSIS  °Diagnosis of an MI involves several tests such as: °· An assessment of your vital signs such as heart rhythm, blood pressure, respiratory rate, and oxygen level. °· An EKG (ECG) to look at the electrical activity of your heart. °· Blood tests called cardiac markers are drawn at scheduled times to measure proteins or enzymes released by the damaged heart muscle. °· A chest X-ray. °· An echocardiogram to evaluate heart motion and blood flow. °· Coronary angiography (cardiac catheterization). This is a diagnostic procedure to look at the heart arteries. °TREATMENT  °Acute Intervention. For   an MI, the national standard in the [place] is to have an acute intervention in under 90 minutes from the time you get to the hospital. An acute intervention is a special procedure to open up the heart arteries. It is done in a treatment room called a "catheterization lab" (cath lab). Some hospitals do no have a cath lab. If you are having an MI and the hospital does not have a cath lab, the standard is to transport you  to a hospital that has one. In the cath lab, acute intervention includes:  Angioplasty. An angioplasty involves inserting a thin, flexible tube (catheter) into an artery in either your groin or wrist. The catheter is threaded to the heart arteries. A balloon at the end of the catheter is inflated to open a narrowed or blocked heart artery. During an angioplasty procedure, a small mesh tube (stent) may be used to keep the heart artery open. Depending on your condition and health history, one of two types of stents may be placed:  Drug-eluting stent (DES). A DES is coated with a medicine to prevent scar tissue from growing over the stent. With drug-eluting stents, blood thinning medicine will need to be taken for up to a year.  Bare metal stent. This type of stent has no special coating to keep tissue from growing over it. This type of stent is used if you cannot take blood thinning medicine for a prolonged time or you need surgery in the near future. After a bare metal stent is placed, blood thinning medicine will need to be taken for about a month.  If you are taking blood thinning medicine (anti-platelet therapy) after stent placement, do not stop taking it unless your caregiver says it is okay to do so. Make sure you understand how long you need to take the medicine. Surgical Intervention  If an acute intervention is not successful, surgery may be needed:  Open heart surgery (coronary artery bypass graft, CABG). CABG takes a vein (saphenous vein) from your leg. The vein is then attached to the blocked heart artery which bypasses the blockage. This then allows blood flow to the heart muscle. Additional Interventions  A "clot buster" medicine (thrombolytic) may be given. This medicine can help break up a clot in the heart artery. This medicine may be given if a person cannot get to a cath lab right away.  Intra-aortic balloon pump (IABP). If you have suffered a very severe MI and are too unstable  to go to the cath lab or to surgery, an IABP may be used. This is a temporary mechanical device used to increase blood flow to the heart and reduce the workload of the heart until you are stable enough to go to the cath lab or surgery. HOME CARE INSTRUCTIONS After an MI, you may need the following:  Medicine. Take medicine as directed by your caregiver. Medicines after an MI may:  Keep your blood from clotting easily (blood thinners).  Control your blood pressure.  Help lower your cholesterol.  Control abnormal heart rhythms.  Lifestyle changes. Under the guidance of your caregiver, lifestyle changes include:  Quitting smoking, if you smoke. Your caregiver can help you quit.  Being physically active.  Maintaining a healthy weight.  Eating a heart healthy diet. A dietitian can help you learn healthy eating options.  Managing diabetes.  Reducing stress.  Limiting alcohol intake. SEEK IMMEDIATE MEDICAL CARE IF:   You have severe chest pain, especially if the pain is crushing  or pressure-like and spreads to the arms, back, neck, or jaw. This is an emergency. Do not wait to see if the pain will go away. Get medical help at once. Call your local emergency services (911 in the U.S.). Do not drive yourself to the hospital.  You have shortness of breath during rest, sleep, or with activity.  You have sudden sweating or clammy skin.  You feel sick to your stomach (nauseous) and throw up (vomit).  You suddenly become lightheaded or dizzy.  You feel your heart beating rapidly or you notice "skipped" beats. MAKE SURE YOU:   Understand these instructions.  Will watch your condition.  Will get help right away if you are not doing well or get worse. Document Released: 09/04/2005 Document Revised: 09/09/2013 Document Reviewed: 11/07/2013 [employer] Patient Information 2015 [place], [place]. This information is not intended to replace advice given to you by your health care provider.  Make sure you discuss any questions you have with your health care provider.  Coronary Angiogram With Stent, Care After Refer to this sheet in the next few weeks. These instructions provide you with information on caring for yourself after your procedure. Your health care provider may also give you more specific instructions. Your treatment has been planned according to current medical practices, but problems sometimes occur. Call your health care provider if you have any problems or questions after your procedure.  WHAT TO EXPECT AFTER THE PROCEDURE  The insertion site may be tender for a few days after your procedure. HOME CARE INSTRUCTIONS   Take medicines only as directed by your health care provider. Blood thinners may be prescribed after your procedure to improve blood flow through the stent.  Change any bandages (dressings) as directed by your health care provider.   Check your insertion site every day for redness, swelling, or fluid leaking from the insertion.   Do not take baths, swim, or use a hot tub until your health care provider approves. You may shower. Pat the insertion area dry. Do not rub the insertion area with a washcloth or towel.   Eat a heart-healthy diet. This should include plenty of fresh fruits and vegetables. Meat should be lean cuts. Avoid the following types of food:   Food that is high in salt.   Canned or highly processed food.   Food that is high in saturated fat or sugar.   Fried food.   Make any other lifestyle changes recommended by your health care provider. This may include:   Not using any tobacco products including cigarettes, chewing tobacco, or electronic cigarettes.  Managing your weight.   Getting regular exercise.   Managing your blood pressure.   Limiting your alcohol intake.   Managing other health problems, such as diabetes.   If you need an MRI after your heart stent was placed, be sure to tell the health care  provider who orders the MRI that you have a heart stent.   Keep all follow-up visits as directed by your health care provider.  SEEK IMMEDIATE MEDICAL CARE IF:   You develop chest pain, shortness of breath, feel faint, or pass out.  You have bleeding, swelling larger than a walnut, or drainage from the catheter insertion site.  You develop pain, discoloration, coldness, or severe bruising in the leg or arm that held the catheter.  You develop bleeding from any other place such as from the bowels. There may be bright red blood in the urine or stools, or it may appear  as black, tarry stools.  You have a fever or chills. MAKE SURE YOU:  Understand these instructions.  Will watch your condition.  Will get help right away if you are not doing well or get worse. Document Released: 03/24/2005 Document Revised: 01/19/2014 Document Reviewed: 02/05/2013 [facility] Patient Information 2015 [place], [place]. This information is not intended to replace advice given to you by your health care provider. Make sure you discuss any questions you have with your health care provider.

## 2015-04-20 NOTE — Progress Notes (Signed)
IV/Tele removed per MD order.  Pt very eager to go home but then became dizzy at end of reviewing d/c instructions w/ pt and wife.  Pt assisted to chair.  Pt had visible tremors to head and hands at that time which were not that before pt got dizzy.  Wife states this happened in ED on arrival this admission but they "forgot to tell someone."  Pt denies feeling anxious but was frustrated about not being able to control the tremors.  Vin PA here and in to observe pt.  BP 157/63.  Dizziness resolved quickly after sitting down and tremors gone after about 10 minutes.  PA advised ok to d/c home.  Pt still wanting to go home.  Reviewed need to rise slowly and make sure not dizzy before walking away from bed or chair at home.  Advised s/s stroke with pt and wife and when to call 911.  Both voice understanding.  To front lobby per w/c with NT.

## 2015-04-20 NOTE — Progress Notes (Signed)
  Echocardiogram 2D Echocardiogram has been performed.  Darlina Sicilian M 04/20/2015, 9:34 AM

## 2015-04-21 ENCOUNTER — Telehealth: Payer: Self-pay | Admitting: Cardiovascular Disease

## 2015-04-21 NOTE — Telephone Encounter (Signed)
Patient contacted regarding discharge from [facility] on 04/20/2015.  Patient understands to follow up with provider Richardson Dopp PA-C on 04/29/2015 at 9:30 AM at [facility] location. Patient understands discharge instructions? yes Patient understands medications and regiment? yes Patient understands to bring all medications to this visit? yes  The pt did not have any other questions or concerns regarding his hospitalization.

## 2015-04-21 NOTE — Telephone Encounter (Signed)
Patient contacted regarding discharge from [facility] on 04/20/15.  Patient understands to follow up with provider Richardson Dopp, PA on 04/29/15 at 9:30a at [facility]. Patient understands discharge instructions? yes Patient understands medications and regiment? yes Patient understands to bring all medications to this visit? yes

## 2015-04-21 NOTE — Telephone Encounter (Signed)
Pt needs a TOC phone call  He is scheduled to see Richardson Dopp on 8/11 at 9:30am   Thanks

## 2015-04-26 ENCOUNTER — Telehealth: Payer: Self-pay | Admitting: Cardiovascular Disease

## 2015-04-26 NOTE — Telephone Encounter (Signed)
Pt has bruise on leg -very large and getting bigger had procedure done 04-19-15 and went through that leg-no redness or signs of infection  pls advise [phone] wife Darla

## 2015-04-26 NOTE — Telephone Encounter (Signed)
Rounted to [facility] triage

## 2015-04-26 NOTE — Telephone Encounter (Signed)
Spoke to patient regarding post-cath bruise he has on inside of thigh. Notes bruising apparent just after his cath procedure. Notes cath site had been bruised but now normal appearance.  Concerned bc bruise seemed to "travel" down his leg.  Advised normal. Informed him bruise may be delayed in disappearing but this is expected, bruise may travel d/t gravity, also may appear to grow larger as blood under skin flattens out. As long as no new pain, no problem.  Informed him to call if any new pain or unusual new bruising at different site. Pt voiced understanding, thanks for the call.

## 2015-04-28 NOTE — Progress Notes (Signed)
Cardiology Office Note   Date:  04/29/2015   ID:  Cisco Kindt, DOB 03/12/1946, MRN [id number]  PCP:  Pcp Not In System  Cardiologist:  Dr. Quay Burow   Electrophysiologist:  n/a  Chief Complaint  Patient presents with  . Hospitalization Follow-up    NSTEMI >> PCI to RCA  . Coronary Artery Disease     History of Present Illness: Trevor Hood is a 69 y.o. male with a hx of CAD status post non-STEMI in 12/08 treated with PCI to the distal RCA and PCI in 1/09 to LCx with a DES while living in [place], [place]. He had patent stents at cardiac catheterization in 2011 while in [place]. Other history includes H in, HL, diabetes, prior smoking history, carotid stenosis (reported 100% on the right and 50% on the left). He has been followed at the [facility] in [place].  Admitted 8/1-8/2 with NSTEMI.  He presented with nocturnal chest discomfort awakening him from sleep with associated left arm pain. LHC demonstrated patent LCx stent. There was mid RCA 95% stenosis as well as distal 95% in-stent restenosis. EF was 45% with inferobasal wall motion abnormality. He underwent PCI with a Xience DES 2 to the RCA. Echocardiogram post PCI demonstrated normal LV function with no regional wall motion abnormalities, mild diastolic dysfunction and mild aortic stenosis. Of note, he is intolerant of Plavix. He was placed on aspirin Brilinta. Plan is to follow-up with the cardiologist at the [facility] in the future.  He returns for follow-up.  Here with his wife.  He has to FU with the PCP at the [facility] in order to get approved to FU with Dr. Gwenlyn Found here.  He wants to remain established with our practice.  He denies any chest pain since DC.  Denies dyspnea, orthopnea, PND, edema.  R groin is bruised and painful.  He has noted a knot as well that is large.   Studies/Reports Reviewed Today:  Echo 04/20/15 Mild LVH, EF 60-65%, no RWMA, Gr 1 DD, mild AS (peak 12 mmHg), trivial AI  LHC 04/19/15 LCX:   Stent patent RCA:  Mid 95%, mid to dist 95% ISR EF:  The left ventricular size is normal. There is mild to moderate left ventricular systolic dysfunction. The left ventricular ejection fraction is 45-50% by visual estimate. There are wall motion abnormalities in the left ventricle. Moderate inferobasal hypokinesia PCI:  3 x 15 mm and 2.75 x 33 mm Xience Alpine DES to RCA    Past Medical History  Diagnosis Date  . Hypertension   . High cholesterol   . Coronary artery disease   . Type II diabetes mellitus   . Pneumonia 2014  . Arthritis     "shoulders" (04/19/2015)  . Chronic lower back pain   . Skin cancer     "no OR's; don't know what kind"  . Vertigo   . NSTEMI (non-ST elevated myocardial infarction) 08/2007    Past Surgical History  Procedure Laterality Date  . Coronary angioplasty with stent placement  08/2007; 09/2007    DES x2 to distal RCA 2008; DES to circ 2009  . Cardiac catheterization N/A 04/19/2015    Procedure: Left Heart Cath and Coronary Angiography;  Surgeon: Lorretta Harp, MD;  Location: [facility];  Service: Cardiovascular;  Laterality: N/A;  . Cardiac catheterization  2011  . Tonsillectomy    . Appendectomy    . Parathyroidectomy  2011  . Forearm fracture surgery Left 1981    "  had a bone graft"  . Fracture surgery       Current Outpatient Prescriptions  Medication Sig Dispense Refill  . acetaminophen (TYLENOL) 500 MG tablet Take 500 mg by mouth 2 (two) times daily as needed for moderate pain.    Marland Kitchen amLODipine (NORVASC) 5 MG tablet Take 5 mg by mouth daily.    Marland Kitchen aspirin 81 MG tablet Take 1 tablet (81 mg total) by mouth daily.    . cetirizine (ZYRTEC) 10 MG tablet Take 10 mg by mouth daily as needed for allergies.    . Coenzyme Q10 (COQ-10 PO) Take 1 tablet by mouth daily.    Marland Kitchen gabapentin (NEURONTIN) 600 MG tablet Take 600 mg by mouth 3 (three) times daily.    [name] DINITRATE-HYDRALAZINE PO Take 60 mg by mouth daily.    Marland Kitchen losartan (COZAAR) 100  MG tablet Take 100 mg by mouth daily.    . metoprolol (LOPRESSOR) 50 MG tablet Take 50 mg by mouth daily.     . nitroGLYCERIN (NITROSTAT) 0.4 MG SL tablet Place 0.4 mg under the tongue every 5 (five) minutes as needed for chest pain.    . Omega-3 Fatty Acids (FISH OIL) 1000 MG CAPS Take 1 capsule by mouth daily.    Marland Kitchen omeprazole (PRILOSEC) 20 MG capsule Take 20 mg by mouth daily as needed (for heartburn).    . rosuvastatin (CRESTOR) 20 MG tablet Take 20 mg by mouth every evening.    Marland Kitchen Specialty Vitamins Products (ONE-A-DAY PROSTATE PO) Take 1 tablet by mouth daily.    . ticagrelor (BRILINTA) 90 MG TABS tablet Take 1 tablet (90 mg total) by mouth 2 (two) times daily. 60 tablet 6  . traMADol (ULTRAM) 50 MG tablet Take 50 mg by mouth 3 (three) times daily as needed (for back pain).      No current facility-administered medications for this visit.    Allergies:   Penicillins    Social History:  The patient  reports that he quit smoking about 31 years ago. His smoking use included Cigarettes. He has a 57 pack-year smoking history. He has never used smokeless tobacco. He reports that he drinks about 8.4 oz of alcohol per week. He reports that he does not use illicit drugs.   Family History:  The patient's family history includes Hypertension in his mother.    ROS:   Please see the history of present illness.   Review of Systems  Hematologic/Lymphatic: Bruises/bleeds easily.  All other systems reviewed and are negative.     PHYSICAL EXAM: VS:  BP 122/60 mmHg  Pulse 80  Ht 5\' 8"  (1.727 m)  Wt 184 lb 12.8 oz (83.825 kg)  BMI 28.11 kg/m2    Wt Readings from Last 3 Encounters:  04/29/15 184 lb 12.8 oz (83.825 kg)  04/20/15 183 lb 10.3 oz (83.3 kg)     GEN: Well nourished, well developed, in no acute distress HEENT: normal Neck: no JVD,  no masses Cardiac:  Normal S1/S2, RRR; no murmur ,  no rubs or gallops, no edema; R groin with probable medium sized hematoma, ? Pulsatile, no bruit;  extensive ecchymosis surrounding the area Respiratory:  clear to auscultation bilaterally, no wheezing, rhonchi or rales. GI: soft, nontender, nondistended, + BS MS: no deformity or atrophy Skin: warm and dry  Neuro:  CNs II-XII intact, Strength and sensation are intact Psych: Normal affect   EKG:  EKG is ordered today.  It demonstrates:   NSR, HR 80, inferior Q waves,  T-wave inversion 3, aVF, no change from prior tracing   Recent Labs: 04/20/2015: ALT 22; BUN 18; Creatinine, Ser 1.07; Hemoglobin 13.1; Platelets 219; Potassium 4.7; Sodium 139    Lipid Panel    Component Value Date/Time   CHOL 179 04/29/2015 1052   TRIG 186.0* 04/29/2015 1052   HDL 49.20 04/29/2015 1052   CHOLHDL 4 04/29/2015 1052   VLDL 37.2 04/29/2015 1052   [place] 93 04/29/2015 1052      ASSESSMENT AND PLAN:  1.  CAD: He is doing well after recent non-STEMI treated with placement of drug-eluting stent 2 to the RCA. Follow-up echocardiogram demonstrated normal LV function. We discussed the importance of dual antiplatelet therapy. Continue aspirin, Brilinta, beta blocker, ARB, statin, nitrates.  Plan cardiac rehabilitation if he can get approved through the [employer].  If he cannot get approved to follow-up with Dr. Gwenlyn Found, he will follow-up with a cardiologist at the [facility].  2.  Hypertension: Controlled.  3.  Hyperlipidemia: Continue statin. Lipids have not been checked in several months. Crestor was started several months ago. Check lipids today.  4.  Carotid stenosis: Followed by vascular surgery at [facility].  5.  Diabetes mellitus: Follow-up with primary care.    Medication Changes: Current medicines are reviewed at length with the patient today.  Concerns regarding medicines are as outlined above.  The following changes have been made:   Discontinued Medications   CO-ENZYME Q-10 30 MG CAPSULE    Take 20 mg by mouth daily.    Modified Medications   No medications on file   New Prescriptions   No medications on file    Labs/ tests ordered today include:   Orders Placed This Encounter  Procedures  . Lipid Profile  . EKG 12-Lead     Disposition:   FU with Dr. Quay Burow 2 mos.    Signed, Versie Starks, MHS 04/29/2015 5:11 PM    [facility] [address], [place], [place]  [address] Phone: [phone]; Fax: [phone]

## 2015-04-29 ENCOUNTER — Ambulatory Visit (INDEPENDENT_AMBULATORY_CARE_PROVIDER_SITE_OTHER): Payer: Medicare Other | Admitting: Physician Assistant

## 2015-04-29 ENCOUNTER — Encounter: Payer: Self-pay | Admitting: Physician Assistant

## 2015-04-29 VITALS — BP 122/60 | HR 80 | Ht 68.0 in | Wt 184.8 lb

## 2015-04-29 DIAGNOSIS — T148 Other injury of unspecified body region: Secondary | ICD-10-CM | POA: Diagnosis not present

## 2015-04-29 DIAGNOSIS — I2510 Atherosclerotic heart disease of native coronary artery without angina pectoris: Secondary | ICD-10-CM | POA: Diagnosis not present

## 2015-04-29 DIAGNOSIS — R9431 Abnormal electrocardiogram [ECG] [EKG]: Secondary | ICD-10-CM

## 2015-04-29 DIAGNOSIS — I6523 Occlusion and stenosis of bilateral carotid arteries: Secondary | ICD-10-CM

## 2015-04-29 DIAGNOSIS — I10 Essential (primary) hypertension: Secondary | ICD-10-CM

## 2015-04-29 DIAGNOSIS — E785 Hyperlipidemia, unspecified: Secondary | ICD-10-CM | POA: Diagnosis not present

## 2015-04-29 DIAGNOSIS — T148XXA Other injury of unspecified body region, initial encounter: Secondary | ICD-10-CM

## 2015-04-29 LAB — LIPID PANEL
CHOL/HDL RATIO: 4
CHOLESTEROL: 179 mg/dL (ref 0–200)
HDL: 49.2 mg/dL (ref >39.00)
LDL Cholesterol: 93 mg/dL (ref 0–99)
NonHDL: 130.15
TRIGLYCERIDES: 186 mg/dL — AB (ref 0.0–149.0)
VLDL: 37.2 mg/dL (ref 0.0–40.0)

## 2015-04-29 NOTE — Patient Instructions (Signed)
Medication Instructions:  Your physician recommends that you continue on your current medications as directed. Please refer to the Current Medication list given to you today.   Labwork: TODAY LIPID PANEL  Testing/Procedures: YOU WILL NEED TO HAVE A RIGHT GROIN [place] TO R/O A PSEUDOANEURYSM; DX HEMATOMA; THIS WILL BE DONE AT THE [facility]    Follow-Up: 2 MONTHS WITH DR. Gwenlyn Found  Any Other Special Instructions Will Be Listed Below (If Applicable). [facility] SO THAT WE MAY ARRANGE THIS FOR YOU.

## 2015-04-30 ENCOUNTER — Telehealth: Payer: Self-pay | Admitting: *Deleted

## 2015-04-30 ENCOUNTER — Ambulatory Visit (HOSPITAL_COMMUNITY)
Admission: RE | Admit: 2015-04-30 | Discharge: 2015-04-30 | Disposition: A | Discharge status: Home / Self Care | Payer: Medicare Other | Source: Ambulatory Visit | Attending: Cardiovascular Disease | Admitting: Cardiovascular Disease

## 2015-04-30 DIAGNOSIS — E785 Hyperlipidemia, unspecified: Secondary | ICD-10-CM

## 2015-04-30 DIAGNOSIS — I10 Essential (primary) hypertension: Secondary | ICD-10-CM | POA: Diagnosis not present

## 2015-04-30 DIAGNOSIS — R1900 Intra-abdominal and pelvic swelling, mass and lump, unspecified site: Secondary | ICD-10-CM | POA: Diagnosis not present

## 2015-04-30 DIAGNOSIS — Z87891 Personal history of nicotine dependence: Secondary | ICD-10-CM | POA: Insufficient documentation

## 2015-04-30 DIAGNOSIS — I739 Peripheral vascular disease, unspecified: Secondary | ICD-10-CM | POA: Diagnosis not present

## 2015-04-30 DIAGNOSIS — E119 Type 2 diabetes mellitus without complications: Secondary | ICD-10-CM | POA: Diagnosis not present

## 2015-04-30 DIAGNOSIS — Z79899 Other long term (current) drug therapy: Secondary | ICD-10-CM

## 2015-04-30 DIAGNOSIS — I2510 Atherosclerotic heart disease of native coronary artery without angina pectoris: Secondary | ICD-10-CM | POA: Diagnosis not present

## 2015-04-30 DIAGNOSIS — T148XXA Other injury of unspecified body region, initial encounter: Secondary | ICD-10-CM

## 2015-04-30 MED ORDER — ROSUVASTATIN CALCIUM 40 MG PO TABS: 40.0000 mg | ORAL_TABLET | Freq: Every evening | ORAL | Status: AC

## 2015-04-30 NOTE — Telephone Encounter (Signed)
Pt walked into [facility] office and was given lab results by Leta Baptist RN as well as Rx [facility]. New Rx for increased dose of Crestor sent in per [place].

## 2015-04-30 NOTE — Telephone Encounter (Signed)
Patient came into the [facility] office  for a test and asked who had been trying to reach him.  I looked in his chart and saw that a message had been left about his blood work.  I reviewed the results with the patient. I sent in the new Crestor RX and handed patient the lab slip to have his blood work rechecked.  He will have the labs done through the [facility] per his request.

## 2015-04-30 NOTE — Telephone Encounter (Signed)
lmptcb to go over lab results 

## 2015-05-03 ENCOUNTER — Telehealth: Payer: Self-pay | Admitting: *Deleted

## 2015-05-03 NOTE — Telephone Encounter (Signed)
lmptcb to go over U/S results

## 2015-05-03 NOTE — Telephone Encounter (Signed)
Pt notified of [place] results by phone with verbal understanding.

## 2015-05-06 ENCOUNTER — Telehealth: Payer: Self-pay | Admitting: Cardiovascular Disease

## 2015-05-06 NOTE — Telephone Encounter (Signed)
Returned call to patient no answer.LMTC. 

## 2015-05-06 NOTE — Telephone Encounter (Signed)
Pt called in stating that his leg where the stent was placed is starting to hurt quite a bit and he wanted to know if this was normally after surgery. Please call and advise  Thanks

## 2015-05-07 NOTE — Telephone Encounter (Signed)
Returned call to patient he stated rt leg better today where he had a cardiac cath.Stated no swelling or redness.Rt leg slightly sore.Stated [place] beach with [place]. Advised to call back if needed.

## 2015-05-11 ENCOUNTER — Telehealth: Payer: Self-pay

## 2015-05-11 NOTE — Telephone Encounter (Signed)
Faxed orders to cardiac rehab

## 2015-07-07 ENCOUNTER — Ambulatory Visit (INDEPENDENT_AMBULATORY_CARE_PROVIDER_SITE_OTHER): Payer: No Typology Code available for payment source | Admitting: Cardiovascular Disease

## 2015-07-07 ENCOUNTER — Encounter: Payer: Self-pay | Admitting: Cardiovascular Disease

## 2015-07-07 VITALS — BP 138/80 | HR 64 | Ht 68.0 in | Wt 186.0 lb

## 2015-07-07 DIAGNOSIS — I10 Essential (primary) hypertension: Secondary | ICD-10-CM | POA: Diagnosis not present

## 2015-07-07 DIAGNOSIS — I779 Disorder of arteries and arterioles, unspecified: Secondary | ICD-10-CM | POA: Diagnosis not present

## 2015-07-07 DIAGNOSIS — I2583 Coronary atherosclerosis due to lipid rich plaque: Secondary | ICD-10-CM

## 2015-07-07 DIAGNOSIS — E785 Hyperlipidemia, unspecified: Secondary | ICD-10-CM | POA: Diagnosis not present

## 2015-07-07 DIAGNOSIS — I2510 Atherosclerotic heart disease of native coronary artery without angina pectoris: Secondary | ICD-10-CM | POA: Insufficient documentation

## 2015-07-07 DIAGNOSIS — I6523 Occlusion and stenosis of bilateral carotid arteries: Secondary | ICD-10-CM

## 2015-07-07 DIAGNOSIS — I739 Peripheral vascular disease, unspecified: Secondary | ICD-10-CM

## 2015-07-07 LAB — HEPATIC FUNCTION PANEL
ALT: 23 U/L (ref 9–46)
AST: 17 U/L (ref 10–35)
Albumin: 4.4 g/dL (ref 3.6–5.1)
Alkaline Phosphatase: 80 U/L (ref 40–115)
BILIRUBIN DIRECT: 0.1 mg/dL (ref <=0.2)
Indirect Bilirubin: 0.4 mg/dL (ref 0.2–1.2)
Total Bilirubin: 0.5 mg/dL (ref 0.2–1.2)
Total Protein: 7 g/dL (ref 6.1–8.1)

## 2015-07-07 LAB — LIPID PANEL
CHOL/HDL RATIO: 4.3 ratio (ref <=5.0)
Cholesterol: 200 mg/dL (ref 125–200)
HDL: 46 mg/dL (ref >=40)
LDL Cholesterol: 88 mg/dL (ref <130)
Triglycerides: 329 mg/dL — ABNORMAL HIGH (ref <150)
VLDL: 66 mg/dL — AB (ref <30)

## 2015-07-07 NOTE — Assessment & Plan Note (Addendum)
History of CAD status post non-STEMI December 2008  in [place] with PCI and stenting using drug-eluting stents to the distal RCA  As well as stenting of his circumflex coronary artery in January 2009. He had a non-STEMI 04/19/15 with cardiac catheterization revealed a patent circumflex stent and high-grade disease in his mid dominant RCA as well as distal "in-stent restenosis". His RCA was stented with science drug-eluting stents with excellent results. He did well postoperatively. He did have mild to moderate inferobasal hypokinesia at the time of cath however subsequent 2-D echo revealed normal LV function. He currently denies chest pain and shortness of breath on antiplatelet therapy including Brilenta. He does say that he needs back surgery and is somewhat symptomatic and I told him that he can interrupt his antibiotic therapy until August 2017 left the situation is critical and that would require Cangralor overlap

## 2015-07-07 NOTE — Progress Notes (Signed)
07/07/2015 Trevor Hood   Sep 04, 1946  [id number]  Primary Physician Pcp Not In System Primary Cardiologist: Trevor Harp MD Trevor Hood   HPI:  Trevor Hood is a delightful 69 year old married Caucasian male father of 5 daughters, grandfather and 6 grandchildren who is taking care of at the current [facility]. 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 His cardiac risk factors are notable for 60 pack years having quit back in 1985. He does have treated hypertension and hyperlipidemia. He had a non-STEMI in December 2008 in [place] and underwent stenting of his distal RCA using overlapping drug-eluting stents. In January 2009 he had PCI and stenting of the circumflex coronary artery. Earlier prior to his recent admission he was complaining of nitroglycerin responsive angina. He had a non-STEMI on August 1 of this year and I performed cardiac catheterization on him revealing a patent circumflex stent with high-grade mid and distal RCA stenosis both of which I stented with Xience  drug-eluting stents. He has done well since.   Current Outpatient Prescriptions  Medication Sig Dispense Refill  . acetaminophen (TYLENOL) 500 MG tablet Take 500 mg by mouth 2 (two) times daily as needed for moderate pain.    Marland Kitchen amLODipine (NORVASC) 5 MG tablet Take 5 mg by mouth daily.    Marland Kitchen aspirin 81 MG tablet Take 1 tablet (81 mg total) by mouth daily. (Patient taking differently: Take 81 mg by mouth 2 (two) times daily. )    . cetirizine (ZYRTEC) 10 MG tablet Take 10 mg by mouth daily as needed for allergies.    . Coenzyme Q10 (COQ-10 PO) Take 1 tablet by mouth daily.    Marland Kitchen gabapentin (NEURONTIN) 600 MG tablet Take 600 mg by mouth 3 (three) times daily as needed.     Trevor Hood DINITRATE-HYDRALAZINE PO Take 60 mg by mouth daily.    Marland Kitchen losartan (COZAAR) 100 MG tablet Take 100 mg by mouth daily.    . metoprolol (LOPRESSOR) 50 MG  tablet Take 25 mg by mouth 2 (two) times daily.     . nitroGLYCERIN (NITROSTAT) 0.4 MG SL tablet Place 0.4 mg under the tongue every 5 (five) minutes as needed for chest pain.    . Omega-3 Fatty Acids (FISH OIL) 1000 MG CAPS Take 1 capsule by mouth daily.    Marland Kitchen omeprazole (PRILOSEC) 20 MG capsule Take 20 mg by mouth daily as needed (for heartburn).    . rosuvastatin (CRESTOR) 40 MG tablet Take 1 tablet (40 mg total) by mouth every evening. (Patient taking differently: Take 40 mg by mouth 2 (two) times daily. ) 90 tablet 3  . Specialty Vitamins Products (ONE-A-DAY PROSTATE PO) Take 1 tablet by mouth daily.    . ticagrelor (BRILINTA) 90 MG TABS tablet Take 1 tablet (90 mg total) by mouth 2 (two) times daily. 60 tablet 6  . traMADol (ULTRAM) 50 MG tablet Take 50 mg by mouth 3 (three) times daily as needed (for back pain).      No current facility-administered medications for this visit.    Allergies  Allergen Reactions  . Penicillins     Social History   Social History  . Marital Status: Married    Spouse Name: N/A  . Number of Children: N/A  . Years of Education: N/A   Occupational History  . Not on file.   Social History Main Topics  . Smoking status: Former Smoker -- 3.00 packs/day  for 19 years    Types: Cigarettes    Quit date: 04/02/1984  . Smokeless tobacco: Never Used  . Alcohol Use: 8.4 oz/week    14 Glasses of wine per week     Comment: 04/19/2015 "8oz pour of red wine q night"  . Drug Use: No  . Sexual Activity: Not Currently   Other Topics Concern  . Not on file   Social History Narrative     Review of Systems: General: negative for chills, fever, night sweats or weight changes.  Cardiovascular: negative for chest pain, dyspnea on exertion, edema, orthopnea, palpitations, paroxysmal nocturnal dyspnea or shortness of breath Dermatological: negative for rash Respiratory: negative for cough or wheezing Urologic: negative for hematuria Abdominal: negative for  nausea, vomiting, diarrhea, bright red blood per rectum, melena, or hematemesis Neurologic: negative for visual changes, syncope, or dizziness All other systems reviewed and are otherwise negative except as noted above.    Blood pressure 138/80, pulse 64, height 5\' 8"  (1.727 m), weight 186 lb (84.369 kg).  General appearance: alert and no distress Neck: no adenopathy, no carotid bruit, no JVD, supple, symmetrical, trachea midline and thyroid not enlarged, symmetric, no tenderness/mass/nodules Lungs: clear to auscultation bilaterally Heart: regular rate and rhythm, S1, S2 normal, no murmur, click, rub or gallop Extremities: extremities normal, atraumatic, no cyanosis or edema  EKG not performed today  ASSESSMENT AND PLAN:   Hyperlipidemia History of hyperlipidemia on Crestor 40 mg a day with recent lipid profile performed 04/29/15 revealed a total cholesterol 179, LDL 93 and HDL of 49. We will recheck a lipid and liver profile  Essential hypertension History of hypertension blood pressure measured at 130/80. He is on amlodipine,, isosorbide dinitrate, hydralazine, losartan and metoprolol. Continue current meds at current dosing  Coronary artery disease due to lipid rich plaque History of CAD status post non-STEMI December 2008  in [place] with PCI and stenting using drug-eluting stents to the distal RCA  As well as stenting of his circumflex coronary artery in January 2009. He had a non-STEMI 04/19/15 with cardiac catheterization revealed a patent circumflex stent and high-grade disease in his mid dominant RCA as well as distal "in-stent restenosis". His RCA was stented with science drug-eluting stents with excellent results. He did well postoperatively. He did have mild to moderate inferobasal hypokinesia at the time of cath however subsequent 2-D echo revealed normal LV function. He currently denies chest pain and shortness of breath on antiplatelet therapy including Brilenta. He  does say that he needs back surgery and is somewhat symptomatic and I told him that he can interrupt his antibiotic therapy until August 2017 left the situation is critical and that would require Cangralor overlap      Trevor Harp MD [facility], [facility] 07/07/2015 10:10 AM

## 2015-07-07 NOTE — Patient Instructions (Addendum)
Medication Instructions:  Your physician recommends that you continue on your current medications as directed. Please refer to the Current Medication list given to you today.  If you need a refill on your cardiac medications before your next appointment, please call your pharmacy.   Labwork: Your physician recommends that you return for lab work in: [place] The lab can be found on the FIRST FLOOR of out building in [address]  Testing/Procedures: none  Follow-Up: Your physician wants you to follow-up in: 6 months with Dr. Gwenlyn Found. You will receive a reminder letter in the mail two months in advance. If you don't receive a letter, please call our office to schedule the follow-up appointment.   Any Other Special Instructions Will Be Listed Below (If Applicable).

## 2015-07-07 NOTE — Assessment & Plan Note (Signed)
History of hyperlipidemia on Crestor 40 mg a day with recent lipid profile performed 04/29/15 revealed a total cholesterol 179, LDL 93 and HDL of 49. We will recheck a lipid and liver profile

## 2015-07-07 NOTE — Assessment & Plan Note (Signed)
History of hypertension blood pressure measured at 130/80. He is on amlodipine,, isosorbide dinitrate, hydralazine, losartan and metoprolol. Continue current meds at current dosing

## 2015-07-07 NOTE — Assessment & Plan Note (Signed)
History of an occluded right carotid artery followed annually by a vascular surgeon at [facility]

## 2015-07-09 ENCOUNTER — Telehealth: Payer: Self-pay

## 2015-07-09 DIAGNOSIS — E785 Hyperlipidemia, unspecified: Secondary | ICD-10-CM

## 2015-07-09 NOTE — Telephone Encounter (Signed)
Pt made aware of results no questions at this time.  Repeat labs in 09/2015

## 2015-07-09 NOTE — Telephone Encounter (Signed)
-----   Message from Lorretta Harp, MD sent at 07/08/2015  7:45 AM EDT ----- FLP OK except for elevated Trig. Please find out whether this was fasting. If so, modify diet (low carb, sugar etc) and recheck. Otherwise start fenobibrate and recheck

## 2016-01-05 ENCOUNTER — Ambulatory Visit (INDEPENDENT_AMBULATORY_CARE_PROVIDER_SITE_OTHER): Payer: No Typology Code available for payment source | Admitting: Cardiovascular Disease

## 2016-01-05 ENCOUNTER — Encounter: Payer: Self-pay | Admitting: Cardiovascular Disease

## 2016-01-05 VITALS — BP 163/83 | HR 71 | Ht 68.0 in | Wt 190.6 lb

## 2016-01-05 DIAGNOSIS — I2510 Atherosclerotic heart disease of native coronary artery without angina pectoris: Secondary | ICD-10-CM

## 2016-01-05 DIAGNOSIS — I10 Essential (primary) hypertension: Secondary | ICD-10-CM | POA: Diagnosis not present

## 2016-01-05 DIAGNOSIS — R0602 Shortness of breath: Secondary | ICD-10-CM

## 2016-01-05 DIAGNOSIS — E785 Hyperlipidemia, unspecified: Secondary | ICD-10-CM

## 2016-01-05 DIAGNOSIS — I2583 Coronary atherosclerosis due to lipid rich plaque: Secondary | ICD-10-CM

## 2016-01-05 NOTE — Assessment & Plan Note (Signed)
History of CAD status post non-STEMI December 2008 [place] [facility] echo today with stenting of his distal RCA using overlapping drug-eluting stents. In January 2009 year PCI and stenting of the circumflex. He had a non-STEMI A/1/16 and I performed cardiac catheterization on him revealing a patent circumflex stent with high-grade mid and distal RCA stenosis both which were stented. He has noticed increasing dyspnea on exertion over the last several months. I'm going to give a 2-D echo and an exercise Myoview stress test to rule out an ischemic etiology.

## 2016-01-05 NOTE — Progress Notes (Signed)
01/05/2016 Trevor Hood   Oct 19, 1945  [id number]  Primary Physician Pcp Not In System Primary Cardiologist: Lorretta Harp MD Renae Gloss   HPI:  [name] is a delightful 70 year old married Caucasian male father of 5 daughters, grandfather and 6 grandchildren who is taking care of at the current [facility]. He is accompanied by his wife today. 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 His cardiac risk factors are notable for 60 pack years having quit back in 1985. He does have treated hypertension and hyperlipidemia. He had a non-STEMI in December 2008 in [place] and underwent stenting of his distal RCA using overlapping drug-eluting stents. In January 2009 he had PCI and stenting of the circumflex coronary artery. Earlier prior to his recent admission he was complaining of nitroglycerin responsive angina. He had a non-STEMI on August 1 of this year and I performed cardiac catheterization on him revealing a patent circumflex stent with high-grade mid and distal RCA stenosis both of which I stented with Xience drug-eluting stents. Since I saw him 6 months ago he does complain of increasing dyspnea on exertion over the last several months.   Current Outpatient Prescriptions  Medication Sig Dispense Refill  . acetaminophen (TYLENOL) 500 MG tablet Take 500 mg by mouth 2 (two) times daily as needed for moderate pain.    Marland Kitchen amLODipine (NORVASC) 5 MG tablet Take 5 mg by mouth daily.    Marland Kitchen aspirin 81 MG tablet Take 1 tablet (81 mg total) by mouth daily. (Patient taking differently: Take 81 mg by mouth 2 (two) times daily. )    . cetirizine (ZYRTEC) 10 MG tablet Take 10 mg by mouth daily as needed for allergies.    . Coenzyme Q10 (COQ-10 PO) Take 1 tablet by mouth daily.    Marland Kitchen gabapentin (NEURONTIN) 600 MG tablet Take 600 mg by mouth 3 (three) times daily as needed.     [name] DINITRATE-HYDRALAZINE PO Take 60 mg by mouth daily.    Marland Kitchen losartan (COZAAR) 100 MG tablet Take 100 mg by mouth daily.    . metoprolol (LOPRESSOR) 50 MG tablet Take 25 mg by mouth 2 (two) times daily.     . nitroGLYCERIN (NITROSTAT) 0.4 MG SL tablet Place 0.4 mg under the tongue every 5 (five) minutes as needed for chest pain.    . Omega-3 Fatty Acids (FISH OIL) 1000 MG CAPS Take 1 capsule by mouth daily.    Marland Kitchen omeprazole (PRILOSEC) 20 MG capsule Take 20 mg by mouth daily as needed (for heartburn).    . rosuvastatin (CRESTOR) 40 MG tablet Take 1 tablet (40 mg total) by mouth every evening. (Patient taking differently: Take 40 mg by mouth 2 (two) times daily. ) 90 tablet 3  . Specialty Vitamins Products (ONE-A-DAY PROSTATE PO) Take 1 tablet by mouth daily.    . ticagrelor (BRILINTA) 90 MG TABS tablet Take 1 tablet (90 mg total) by mouth 2 (two) times daily. 60 tablet 6  . traMADol (ULTRAM) 50 MG tablet Take 50 mg by mouth 3 (three) times daily as needed (for back pain).      No current facility-administered medications for this visit.    Allergies  Allergen Reactions  . Penicillins     Social History   Social History  . Marital Status: Married    Spouse Name: N/A  . Number of Children: N/A  .  Years of Education: N/A   Occupational History  . Not on file.   Social History Main Topics  . Smoking status: Former Smoker -- 3.00 packs/day for 19 years    Types: Cigarettes    Quit date: 04/02/1984  . Smokeless tobacco: Never Used  . Alcohol Use: 8.4 oz/week    14 Glasses of wine per week     Comment: 04/19/2015 "8oz pour of red wine q night"  . Drug Use: No  . Sexual Activity: Not Currently   Other Topics Concern  . Not on file   Social History Narrative     Review of Systems: General: negative for chills, fever, night sweats or weight changes.  Cardiovascular: negative for chest pain, dyspnea on exertion, edema, orthopnea, palpitations, paroxysmal nocturnal dyspnea or  shortness of breath Dermatological: negative for rash Respiratory: negative for cough or wheezing Urologic: negative for hematuria Abdominal: negative for nausea, vomiting, diarrhea, bright red blood per rectum, melena, or hematemesis Neurologic: negative for visual changes, syncope, or dizziness All other systems reviewed and are otherwise negative except as noted above.    Blood pressure 163/83, pulse 71, height 5\' 8"  (1.727 m), weight 190 lb 9.6 oz (86.456 kg).  General appearance: alert and no distress Neck: no adenopathy, no carotid bruit, no JVD, supple, symmetrical, trachea midline and thyroid not enlarged, symmetric, no tenderness/mass/nodules Lungs: clear to auscultation bilaterally Heart: regular rate and rhythm, S1, S2 normal, no murmur, click, rub or gallop Extremities: extremities normal, atraumatic, no cyanosis or edema  EKG normal sinus rhythm at 71 without ST or T-wave changes. I personally reviewed this EKG  ASSESSMENT AND PLAN:   Essential hypertension History of hypertension blood pressure measured at 163/83. He is on amlodipine, hydralazine, losartan and metoprolol. Continue current meds at current dosing.  Hyperlipidemia History of hyperlipidemia on statin therapy followed by his PCP.  Coronary artery disease due to lipid rich plaque History of CAD status post non-STEMI December 2008 [place] [facility] echo today with stenting of his distal RCA using overlapping drug-eluting stents. In January 2009 year PCI and stenting of the circumflex. He had a non-STEMI A/1/16 and I performed cardiac catheterization on him revealing a patent circumflex stent with high-grade mid and distal RCA stenosis both which were stented. He has noticed increasing dyspnea on exertion over the last several months. I'm going to give a 2-D echo and an exercise Myoview stress test to rule out an ischemic etiology.      Lorretta Harp MD FACP,FACC,FAHA, [facility] 01/05/2016 10:16  AM

## 2016-01-05 NOTE — Assessment & Plan Note (Signed)
History of hyperlipidemia on statin therapy followed by his PCP 

## 2016-01-05 NOTE — Patient Instructions (Signed)
Medication Instructions:  Your physician recommends that you continue on your current medications as directed. Please refer to the Current Medication list given to you today.   Labwork: none  Testing/Procedures: Your physician has requested that you have en exercise stress myoview. For further information please visit [email or web address]. Please follow instruction sheet, as given.  Your physician has requested that you have an echocardiogram. Echocardiography is a painless test that uses sound waves to create images of your heart. It provides your doctor with information about the size and shape of your heart and how well your heart's chambers and valves are working. This procedure takes approximately one hour. There are no restrictions for this procedure.    Follow-Up: We request that you follow-up in: 6 months with an extender and in 12 months with Dr Andria Rhein will receive a reminder letter in the mail two months in advance. If you don't receive a letter, please call our office to schedule the follow-up appointment.    Any Other Special Instructions Will Be Listed Below (If Applicable).     If you need a refill on your cardiac medications before your next appointment, please call your pharmacy.

## 2016-01-05 NOTE — Assessment & Plan Note (Signed)
History of hypertension blood pressure measured at 163/83. He is on amlodipine, hydralazine, losartan and metoprolol. Continue current meds at current dosing.

## 2016-01-13 ENCOUNTER — Telehealth (HOSPITAL_COMMUNITY): Payer: Self-pay

## 2016-01-13 NOTE — Telephone Encounter (Signed)
Encounter complete. 

## 2016-01-18 ENCOUNTER — Ambulatory Visit (HOSPITAL_COMMUNITY)
Admission: RE | Admit: 2016-01-18 | Discharge: 2016-01-18 | Disposition: A | Discharge status: Home / Self Care | Payer: Medicare Other | Source: Ambulatory Visit | Attending: Cardiology | Admitting: Cardiology

## 2016-01-18 DIAGNOSIS — I779 Disorder of arteries and arterioles, unspecified: Secondary | ICD-10-CM | POA: Diagnosis not present

## 2016-01-18 DIAGNOSIS — I739 Peripheral vascular disease, unspecified: Secondary | ICD-10-CM | POA: Diagnosis not present

## 2016-01-18 DIAGNOSIS — Z87891 Personal history of nicotine dependence: Secondary | ICD-10-CM | POA: Diagnosis not present

## 2016-01-18 DIAGNOSIS — I2510 Atherosclerotic heart disease of native coronary artery without angina pectoris: Secondary | ICD-10-CM | POA: Diagnosis not present

## 2016-01-18 DIAGNOSIS — I10 Essential (primary) hypertension: Secondary | ICD-10-CM | POA: Insufficient documentation

## 2016-01-18 DIAGNOSIS — E119 Type 2 diabetes mellitus without complications: Secondary | ICD-10-CM | POA: Diagnosis not present

## 2016-01-18 DIAGNOSIS — R079 Chest pain, unspecified: Secondary | ICD-10-CM | POA: Diagnosis not present

## 2016-01-18 DIAGNOSIS — I2583 Coronary atherosclerosis due to lipid rich plaque: Secondary | ICD-10-CM

## 2016-01-18 DIAGNOSIS — R0609 Other forms of dyspnea: Secondary | ICD-10-CM | POA: Insufficient documentation

## 2016-01-18 DIAGNOSIS — R0602 Shortness of breath: Secondary | ICD-10-CM | POA: Insufficient documentation

## 2016-01-18 LAB — MYOCARDIAL PERFUSION IMAGING
CHL CUP NUCLEAR SDS: 0
CHL CUP NUCLEAR SRS: 0
CHL CUP NUCLEAR SSS: 0
CHL CUP RESTING HR STRESS: 81 {beats}/min
Estimated workload: 7 METS
Exercise duration (min): 6 min
LV sys vol: 28 mL
LVDIAVOL: 75 mL (ref 62–150)
MPHR: 152 {beats}/min
Peak HR: 130 {beats}/min
Percent HR: 85 %
RPE: 16
TID: 1.32

## 2016-01-18 MED ORDER — TECHNETIUM TC 99M SESTAMIBI GENERIC - CARDIOLITE
30.6000 | Freq: Once | INTRAVENOUS | Status: AC | PRN
  Administered 2016-01-18: 31 via INTRAVENOUS

## 2016-01-18 MED ORDER — TECHNETIUM TC 99M SESTAMIBI GENERIC - CARDIOLITE
10.7000 | Freq: Once | INTRAVENOUS | Status: AC | PRN
  Administered 2016-01-18: 11 via INTRAVENOUS

## 2016-01-19 ENCOUNTER — Telehealth: Payer: Self-pay

## 2016-01-19 NOTE — Telephone Encounter (Signed)
Having chest tightness and SOB past 4 to 5 days; lasting a few hours to few minutes. Most times during the day he is aware of the discomfort but it does not stop him from going about his ADL's. Usually in the evening or middle of the night while resting when he has expericed the CP/tightness.  Does not feel like previous heart attacks, but the pain does just linger. Upper left quadrant of chest that hurts most. The pain does not radiate down his arm but his arm does get sore. Pt stated that he has had these test (stress test) before and they have been normal and he gets a cath and the Dr. finds out he needs stents. Pt wants dr. Gwenlyn Found to be aware of what is going and if he needs to be evaluated again sooner than next appt.

## 2016-01-19 NOTE — Telephone Encounter (Signed)
Pt Myoview results reviewed with pt wife, okay per DPR.    She stated that pt has had CP over the past few days, he has not taken his Nitro, as he didn't think it was that bad. Pt wife stated that he has woken up a few nights by the chest pain.  Asked her to have pt call back to talk to [place] more about the symptoms, okay to speak with triage and will let Dr. Gwenlyn Found know. Pt also scheduled to have an Echo on 5/10.

## 2016-01-19 NOTE — Telephone Encounter (Signed)
Follow-up    Pt returning phone call for Joelene Millin RN about what was discussed with his wife earlier.

## 2016-01-19 NOTE — Telephone Encounter (Signed)
FORWARD TO [facility] RN

## 2016-01-20 ENCOUNTER — Telehealth: Payer: Self-pay | Admitting: Cardiovascular Disease

## 2016-01-20 NOTE — Telephone Encounter (Signed)
Pt called in wanting to let Trevor Hood know that around 3 am he woke up with chest pain again. He said that he took a Nitro and it aided the pain. He just wanted to let Trevor Hood know.   Thanks

## 2016-01-21 ENCOUNTER — Other Ambulatory Visit: Payer: Self-pay

## 2016-01-21 DIAGNOSIS — R079 Chest pain, unspecified: Secondary | ICD-10-CM

## 2016-01-21 NOTE — Telephone Encounter (Signed)
Pt calling to c/o of intermittent Chest Pain over the past few days. Pt has been awakened from sleep over the past 2 nights and took ONE Nitro  With each occurrence that has helped. Pt stated today he is having increasing numbness down his left arm.  Pt to c/o and history reviewed with DOD, Nishan, recommended that pt have heart cath early next week.  Pt had appt this morning at [facility] in [place] BP 160/85 and Home BP the other night was 142/75. Pt stated he has not c/o of SOB, dizziness, or diaphoresis.   Reviewed recommendations with pt, he agrees with plan. Pt needs to [place] for 7 days before procedure. Pt to have cath on 5/12 with Dr. Burt Knack, at 10:30am. Pt aware he will get labs completed at hospital day of procedure and instructions reviewed on the phone. Pt stated that it is

## 2016-01-25 ENCOUNTER — Telehealth: Payer: Self-pay | Admitting: Cardiovascular Disease

## 2016-01-25 NOTE — Telephone Encounter (Signed)
New message     Talk to the nurse about upcoming cath on thurs

## 2016-01-25 NOTE — Telephone Encounter (Signed)
Pt has no c/o of chest pain since last week and dose not feel cath is worth it. Pt stated that this has happened before and his cath came out clean. He will get scheduled ECHO tomorrow 5/10 and see how it comes out and think about cath in the future.     Pt craterization cancelled for 5/11 with Dr. Gwenlyn Found, cath lab contacted.

## 2016-01-25 NOTE — Telephone Encounter (Signed)
Spoke with pt, he has not had anymore chest pain or problems since last week. He is not sure he is needing to have the cath done at this time. He does not feel it is needed. He wanted to talk to [facility] about possibility of cancelling the cath.

## 2016-01-26 ENCOUNTER — Telehealth: Payer: Self-pay | Admitting: Cardiovascular Disease

## 2016-01-26 ENCOUNTER — Ambulatory Visit (HOSPITAL_COMMUNITY): Payer: Medicare Other

## 2016-01-26 NOTE — Telephone Encounter (Signed)
Patient did not have his echocardiogram today because his VA referral had expired. He said that he was not aware and decided not to be seen.  He has Medicare but did not want to file it without filing VA first.  Trevor Hood has been notified.  His Nuclear test will not be covered by [facility] as the test was done after referral expired.  It was, however, filed with Medicare.

## 2016-01-26 NOTE — Telephone Encounter (Signed)
Spoke with pt he gave me number to call to get extension of services with our practice to get test completed and see the MD when needed. [id number] Ex;t; 2022. Number seen to billing along with pt request to be addressed. Pt understands needs to contact our office to setup ECHO once he has spoken to [place] and the extension has been approved.  No additional questions at this time.

## 2016-01-26 NOTE — Telephone Encounter (Signed)
Please call ,he needs explain to you what he needs to do to get an extension with the [place].

## 2016-01-27 ENCOUNTER — Ambulatory Visit (HOSPITAL_COMMUNITY)
Admission: RE | Admit: 2016-01-27 | Payer: No Typology Code available for payment source | Source: Ambulatory Visit | Admitting: Cardiovascular Disease

## 2016-01-27 ENCOUNTER — Encounter (HOSPITAL_COMMUNITY): Admission: RE | Payer: Self-pay | Source: Ambulatory Visit

## 2016-01-27 SURGERY — LEFT HEART CATH AND CORONARY ANGIOGRAPHY

## 2016-07-04 ENCOUNTER — Telehealth: Payer: Self-pay | Admitting: Cardiology

## 2016-07-04 NOTE — Telephone Encounter (Signed)
Called and left message to call and scheduled appointment with [facility].

## 2016-07-06 ENCOUNTER — Telehealth: Payer: Self-pay | Admitting: *Deleted

## 2016-07-06 NOTE — Telephone Encounter (Signed)
Request for Cardiac rehab at [place] at [facility]. Diagnosis code I25.10. Reviewed and signed by Dr Gwenlyn Found. Faxed to [phone]. Document placed to be scanned into EPIC.

## 2017-02-28 IMAGING — CR DG CHEST 2V
2 series · 2 of 2 positions shown · non-contrast
Comparison: None.

CLINICAL DATA: Left chest pain.

EXAM:
CHEST  2 VIEW

[chest pa]
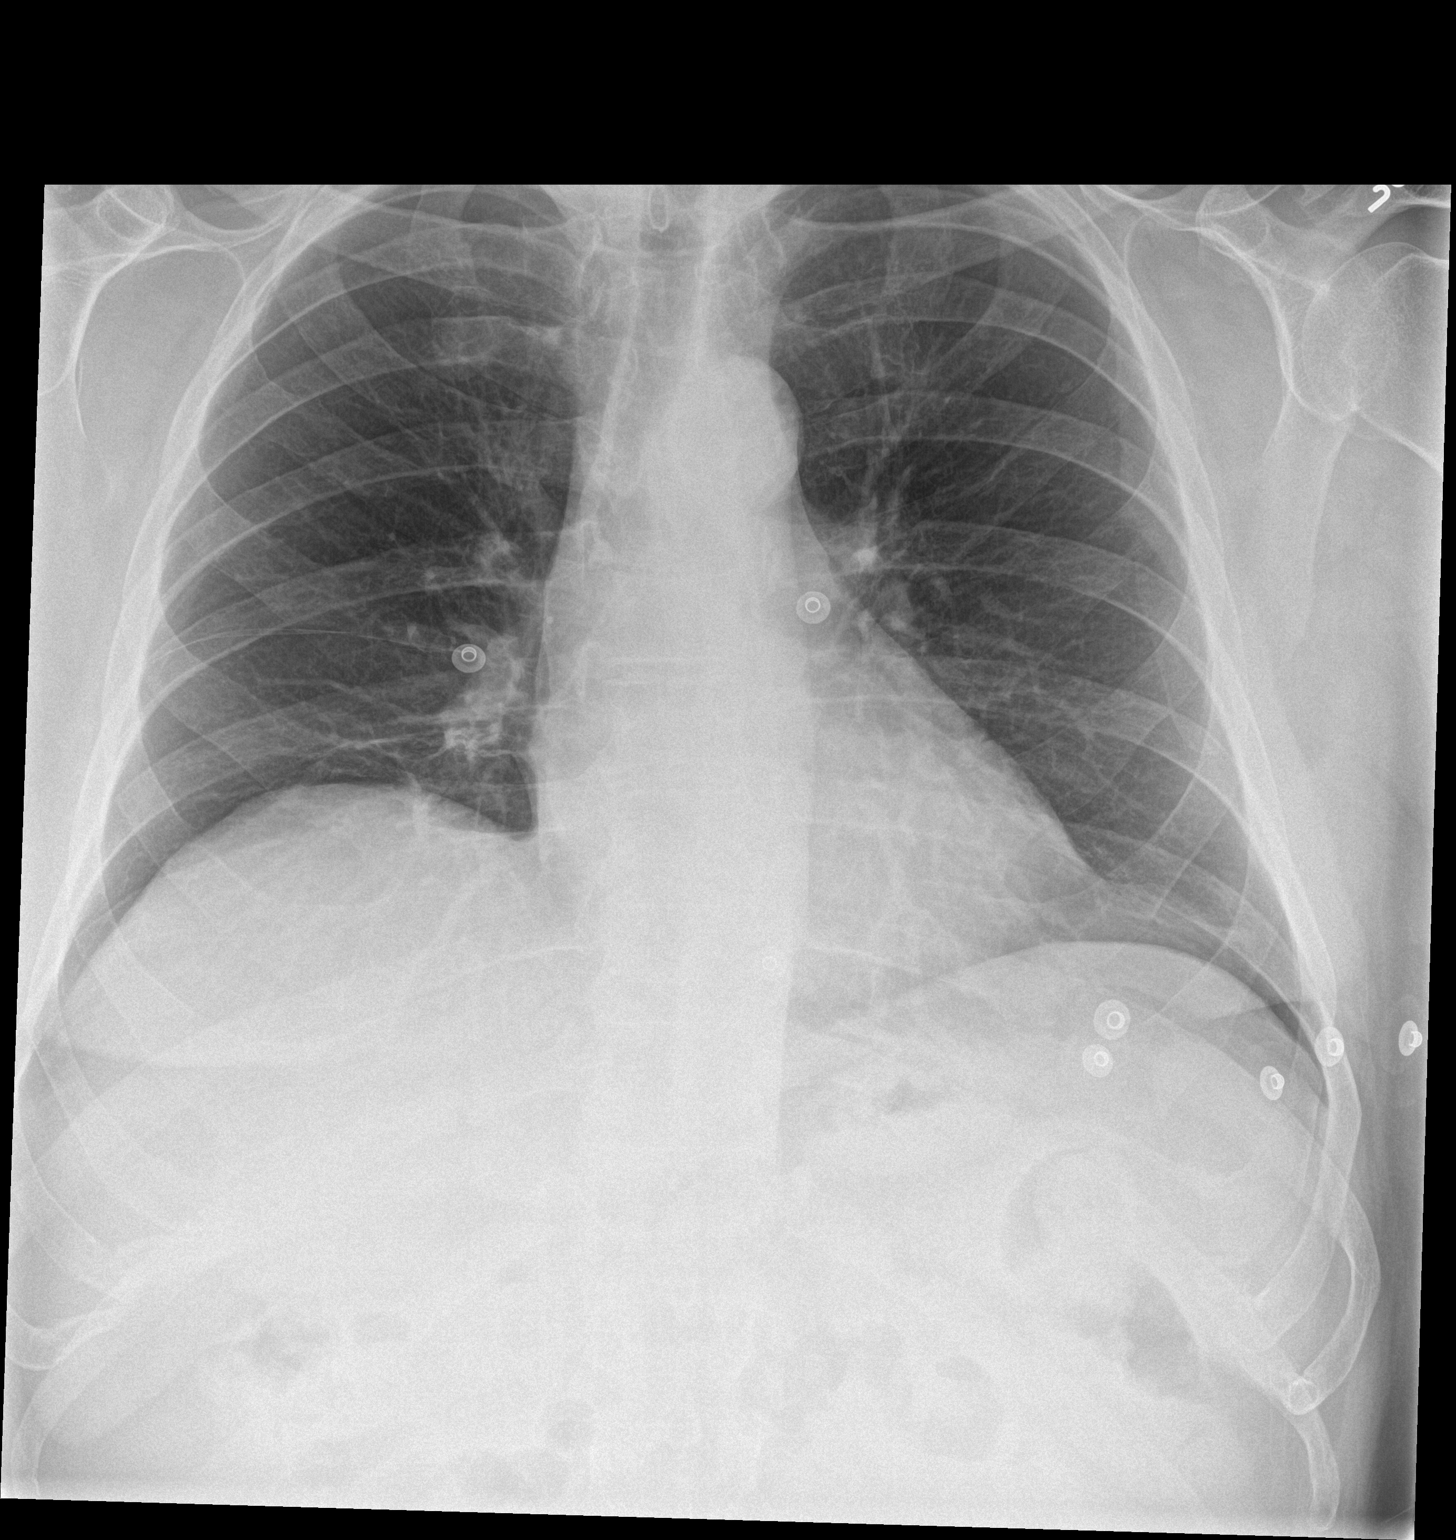

[chest lat]
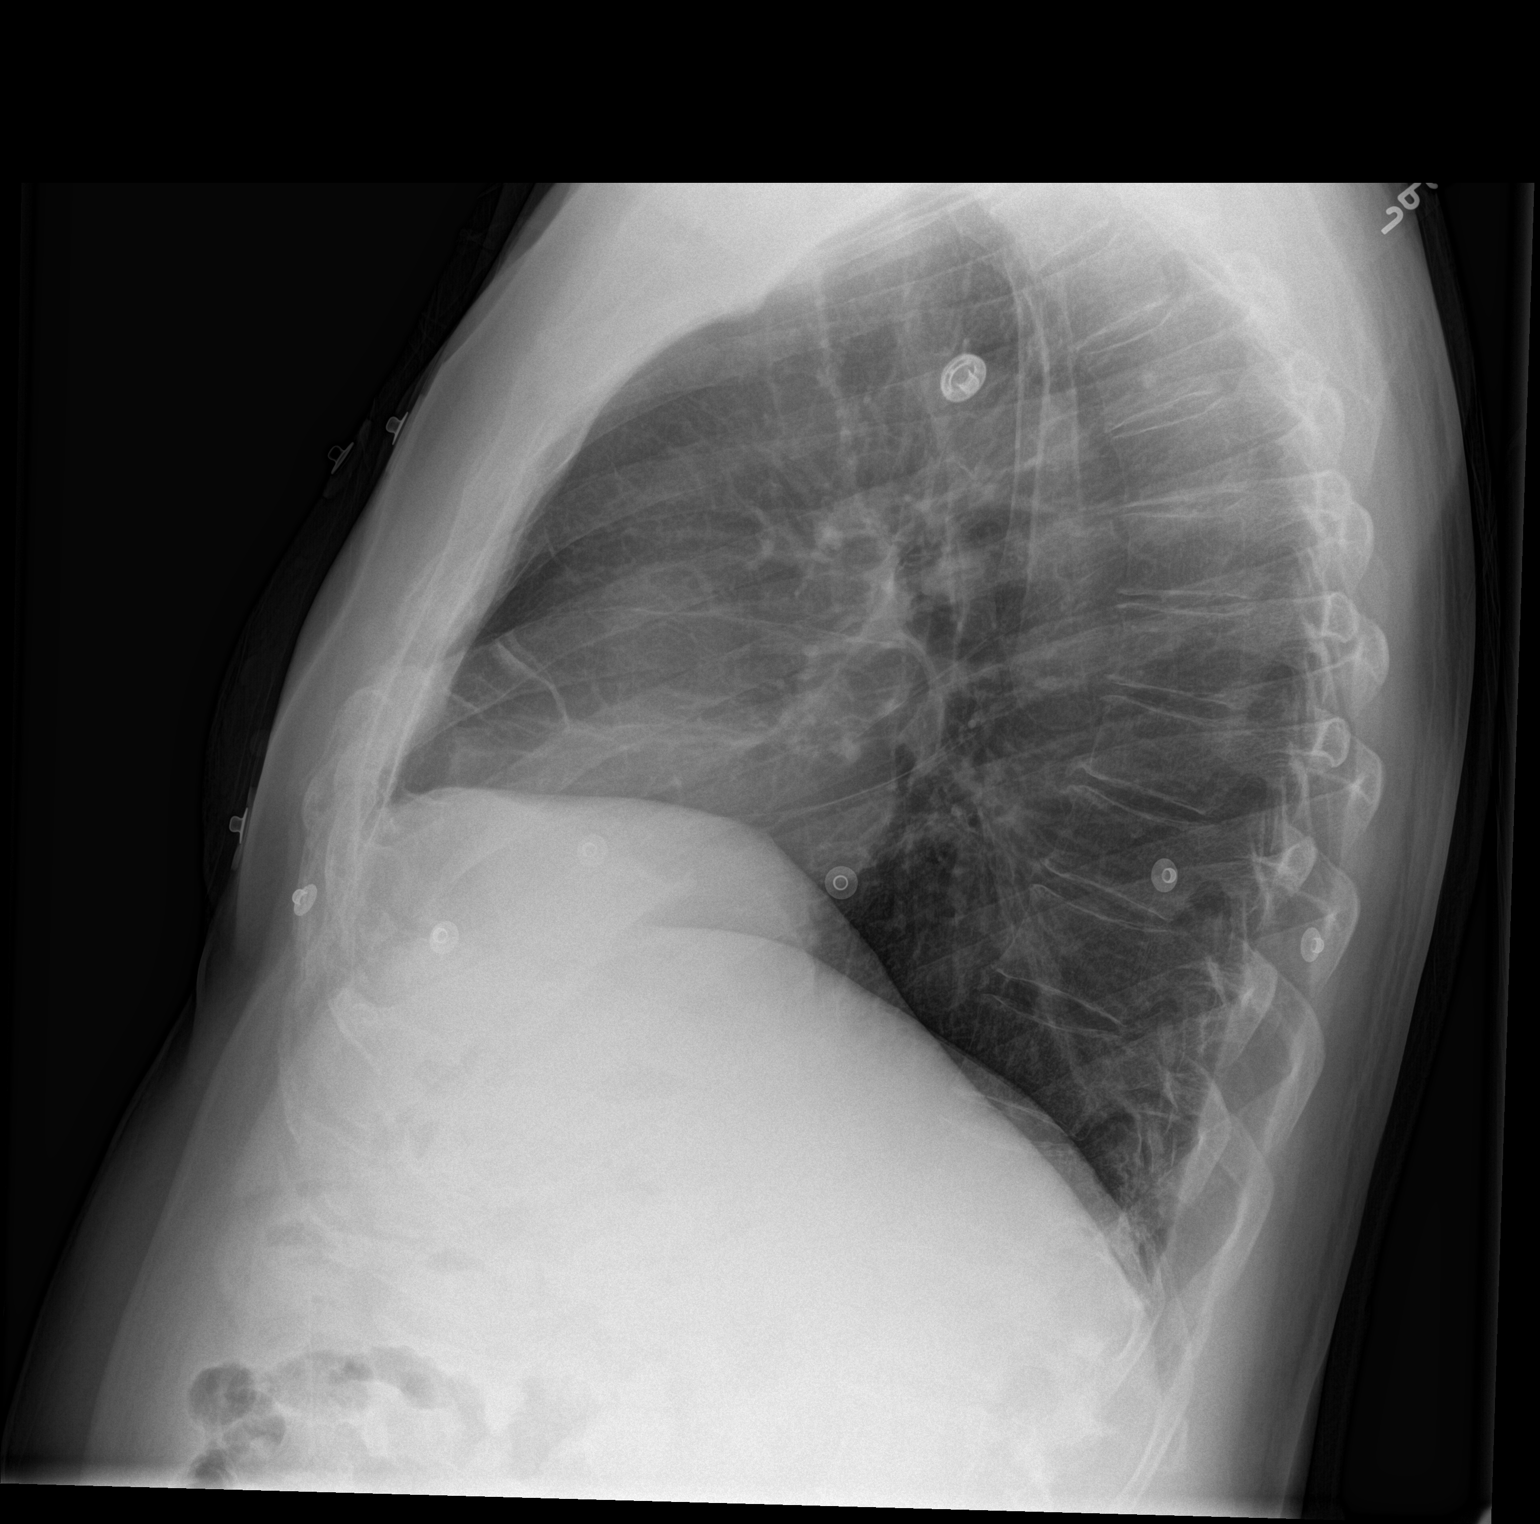

[2 of 2 positions shown; findings below may reference images not displayed]

FINDINGS: Mediastinum and hilar structures normal. Minimal infiltrate right
upper lobe cannot be excluded. Mild bibasilar subsegmental
atelectasis. Heart size normal. No pleural effusion or pneumothorax.
No acute bony abnormality.
IMPRESSION: 1.  Minimal infiltrate right upper lobe cannot be excluded.

2.  Mild bibasilar subsegmental atelectasis.

## 2022-09-18 ENCOUNTER — Encounter (HOSPITAL_BASED_OUTPATIENT_CLINIC_OR_DEPARTMENT_OTHER): Payer: Self-pay | Admitting: Emergency Medicine

## 2022-09-18 ENCOUNTER — Other Ambulatory Visit: Payer: Self-pay

## 2022-09-18 ENCOUNTER — Emergency Department (HOSPITAL_BASED_OUTPATIENT_CLINIC_OR_DEPARTMENT_OTHER)
Admission: EM | Admit: 2022-09-18 | Discharge: 2022-09-18 | Disposition: A | Discharge status: Home / Self Care | Payer: No Typology Code available for payment source | Attending: Emergency Medicine | Admitting: Emergency Medicine

## 2022-09-18 ENCOUNTER — Emergency Department (HOSPITAL_BASED_OUTPATIENT_CLINIC_OR_DEPARTMENT_OTHER): Payer: No Typology Code available for payment source

## 2022-09-18 DIAGNOSIS — D72829 Elevated white blood cell count, unspecified: Secondary | ICD-10-CM | POA: Insufficient documentation

## 2022-09-18 DIAGNOSIS — R7989 Other specified abnormal findings of blood chemistry: Secondary | ICD-10-CM | POA: Diagnosis not present

## 2022-09-18 DIAGNOSIS — N3091 Cystitis, unspecified with hematuria: Secondary | ICD-10-CM | POA: Diagnosis not present

## 2022-09-18 DIAGNOSIS — R319 Hematuria, unspecified: Secondary | ICD-10-CM | POA: Diagnosis present

## 2022-09-18 DIAGNOSIS — Z7982 Long term (current) use of aspirin: Secondary | ICD-10-CM | POA: Insufficient documentation

## 2022-09-18 DIAGNOSIS — N189 Chronic kidney disease, unspecified: Secondary | ICD-10-CM | POA: Diagnosis not present

## 2022-09-18 LAB — URINALYSIS, ROUTINE W REFLEX MICROSCOPIC

## 2022-09-18 LAB — URINALYSIS, MICROSCOPIC (REFLEX): RBC / HPF: >50 RBC/hpf (ref 0–5)

## 2022-09-18 LAB — COMPREHENSIVE METABOLIC PANEL
ALT: 26 U/L (ref 0–44)
AST: 22 U/L (ref 15–41)
Albumin: 4.5 g/dL (ref 3.5–5.0)
Alkaline Phosphatase: 68 U/L (ref 38–126)
Anion gap: 8 (ref 5–15)
BUN: 27 mg/dL — ABNORMAL HIGH (ref 8–23)
CO2: 27 mmol/L (ref 22–32)
Calcium: 9.7 mg/dL (ref 8.9–10.3)
Chloride: 102 mmol/L (ref 98–111)
Creatinine, Ser: 1.62 mg/dL — ABNORMAL HIGH (ref 0.61–1.24)
GFR, Estimated: 44 mL/min — ABNORMAL LOW (ref >60)
Glucose, Bld: 159 mg/dL — ABNORMAL HIGH (ref 70–99)
Potassium: 4.7 mmol/L (ref 3.5–5.1)
Sodium: 137 mmol/L (ref 135–145)
Total Bilirubin: 0.5 mg/dL (ref 0.3–1.2)
Total Protein: 8.2 g/dL — ABNORMAL HIGH (ref 6.5–8.1)

## 2022-09-18 LAB — CBC WITH DIFFERENTIAL/PLATELET
Abs Immature Granulocytes: 0.05 10*3/uL (ref 0.00–0.07)
Basophils Absolute: 0.1 10*3/uL (ref 0.0–0.1)
Basophils Relative: 1 %
Eosinophils Absolute: 0.1 10*3/uL (ref 0.0–0.5)
Eosinophils Relative: 1 %
HCT: 40.2 % (ref 39.0–52.0)
Hemoglobin: 13.6 g/dL (ref 13.0–17.0)
Immature Granulocytes: 0 %
Lymphocytes Relative: 11 %
Lymphs Abs: 1.3 10*3/uL (ref 0.7–4.0)
MCH: 30.2 pg (ref 26.0–34.0)
MCHC: 33.8 g/dL (ref 30.0–36.0)
MCV: 89.3 fL (ref 80.0–100.0)
Monocytes Absolute: 1.2 10*3/uL — ABNORMAL HIGH (ref 0.1–1.0)
Monocytes Relative: 9 %
Neutro Abs: 10 10*3/uL — ABNORMAL HIGH (ref 1.7–7.7)
Neutrophils Relative %: 78 %
Platelets: 247 10*3/uL (ref 150–400)
RBC: 4.5 MIL/uL (ref 4.22–5.81)
RDW: 14.6 % (ref 11.5–15.5)
WBC: 12.7 10*3/uL — ABNORMAL HIGH (ref 4.0–10.5)
nRBC: 0 % (ref 0.0–0.2)

## 2022-09-18 LAB — CBG MONITORING, ED: Glucose-Capillary: 141 mg/dL — ABNORMAL HIGH (ref 70–99)

## 2022-09-18 MED ORDER — CEPHALEXIN 500 MG PO CAPS: 500.0000 mg | ORAL_CAPSULE | Freq: Two times a day (BID) | ORAL | 0 refills | Status: AC

## 2022-09-18 MED ORDER — ONDANSETRON HCL 4 MG/2ML IJ SOLN
4.0000 mg | Freq: Once | INTRAMUSCULAR | Status: DC
  Filled 2022-09-18: qty 2

## 2022-09-18 MED ORDER — SODIUM CHLORIDE 0.9 % IV BOLUS
1000.0000 mL | Freq: Once | INTRAVENOUS | Status: AC
  Administered 2022-09-18: 1000 mL via INTRAVENOUS

## 2022-09-18 MED ORDER — SODIUM CHLORIDE 0.9 % IV SOLN
1.0000 g | Freq: Once | INTRAVENOUS | Status: AC
  Administered 2022-09-18: 1 g via INTRAVENOUS
  Filled 2022-09-18: qty 10

## 2022-09-18 NOTE — ED Triage Notes (Signed)
Pt reports hematuria, R flank pain, frequent urination. Denies fever, or vomiting, but does report some nausea (has this at baseline). Pt also mentioned polydipsia and polyuria. CBG 141.

## 2022-09-18 NOTE — ED Provider Notes (Signed)
[facility]  Provider Note  CSN: [id number] Arrival date & time: 09/18/22 0010  History Chief Complaint  Patient presents with   Hematuria    Trevor Hood is a 77 y.o. male with history of multiple medical problems on Brilinta reports some R lower back pain yesterday he thought was due to overuse, but was having more trouble walking today and then this evening began to have hematuria, urinary frequency and burning. No fever, no vomiting, some nausea.    Home Medications Prior to Admission medications   Medication Sig Start Date End Date Taking? Authorizing Provider  cephALEXin (KEFLEX) 500 MG capsule Take 1 capsule (500 mg total) by mouth 2 (two) times daily for 7 days. 09/18/22 09/25/22 Yes Truddie Hidden, MD  acetaminophen (TYLENOL) 500 MG tablet Take 500 mg by mouth 2 (two) times daily as needed for moderate pain.    [provider]  amLODipine (NORVASC) 5 MG tablet Take 5 mg by mouth daily.    [provider]  aspirin 81 MG tablet Take 1 tablet (81 mg total) by mouth daily. Patient taking differently: Take 81 mg by mouth 2 (two) times daily.  04/20/15   Leanor Kail, PA  cetirizine (ZYRTEC) 10 MG tablet Take 10 mg by mouth daily as needed for allergies.    [provider]  Coenzyme Q10 (COQ-10 PO) Take 1 tablet by mouth daily.    [provider]  gabapentin (NEURONTIN) 600 MG tablet Take 600 mg by mouth 3 (three) times daily as needed.     [provider]  ISOSORB DINITRATE-HYDRALAZINE PO Take 60 mg by mouth daily.    [provider]  losartan (COZAAR) 100 MG tablet Take 100 mg by mouth daily.    [provider]  metoprolol (LOPRESSOR) 50 MG tablet Take 25 mg by mouth 2 (two) times daily.     [provider]  nitroGLYCERIN (NITROSTAT) 0.4 MG SL tablet Place 0.4 mg under the tongue every 5 (five) minutes as needed for chest pain.    [provider]  Omega-3 Fatty  Acids (FISH OIL) 1000 MG CAPS Take 1 capsule by mouth daily.    [provider]  omeprazole (PRILOSEC) 20 MG capsule Take 20 mg by mouth daily as needed (for heartburn).    [provider]  rosuvastatin (CRESTOR) 40 MG tablet Take 1 tablet (40 mg total) by mouth every evening. Patient taking differently: Take 40 mg by mouth 2 (two) times daily.  04/30/15   Liliane Shi, PA-C  Specialty Vitamins Products (ONE-A-DAY PROSTATE PO) Take 1 tablet by mouth daily.    [provider]  ticagrelor (BRILINTA) 90 MG TABS tablet Take 1 tablet (90 mg total) by mouth 2 (two) times daily. 04/20/15   Bhagat, Crista Luria, PA  traMADol (ULTRAM) 50 MG tablet Take 50 mg by mouth 3 (three) times daily as needed (for back pain).     [provider]     Allergies    Penicillins and Plavix [clopidogrel]   Review of Systems   Review of Systems Please see HPI for pertinent positives and negatives  Physical Exam BP (!) 144/61   Pulse 81   Temp 98.5 F (36.9 C) (Oral)   Resp 14   Ht '5\' 8"'$  (1.727 m)   Wt 81.2 kg   SpO2 94%   BMI 27.22 kg/m   Physical Exam Vitals and nursing note reviewed.  Constitutional:      Appearance: Normal appearance.  HENT:     Head: Normocephalic and atraumatic.     Nose: Nose normal.     Mouth/Throat:     Mouth: Mucous membranes are moist.  Eyes:     Extraocular Movements: Extraocular movements intact.     Conjunctiva/sclera: Conjunctivae normal.  Cardiovascular:     Rate and Rhythm: Normal rate.     Heart sounds: Murmur heard.  Pulmonary:     Effort: Pulmonary effort is normal.     Breath sounds: Normal breath sounds.  Abdominal:     General: Abdomen is flat.     Palpations: Abdomen is soft.     Tenderness: There is no abdominal tenderness. There is no guarding.  Musculoskeletal:        General: No swelling. Normal range of motion.     Cervical back: Neck supple.  Skin:    General: Skin is warm and dry.  Neurological:      General: No focal deficit present.     Mental Status: He is alert.  Psychiatric:        Mood and Affect: Mood normal.     ED Results / Procedures / Treatments   EKG None  Procedures Procedures  Medications Ordered in the ED Medications  ondansetron (ZOFRAN) injection 4 mg (4 mg Intravenous Not Given 09/18/22 0445)  sodium chloride 0.9 % bolus 1,000 mL (1,000 mLs Intravenous New Bag/Given 09/18/22 0445)  cefTRIAXone (ROCEPHIN) 1 g in sodium chloride 0.9 % 100 mL IVPB (1 g Intravenous New Bag/Given 09/18/22 0446)    Initial Impression and Plan  Patient here with dysuria and gross hematuria, No fever, vitals reassuring. Labs done in triage show CBC with leukocytosis, UA is grossly blood, but also has WBC and bacteria on micro. I personally viewed the images from radiology studies and agree with radiologist interpretation: CT neg for renal stone. CMP is pending, lab reports a malfunction of the analyzer. In the meantime, will give IVF, antiemetics and a dose of Rocephin.   ED Course   Clinical Course as of 09/18/22 0539  Mon Sep 18, 2022  3300 CMP with mildly elevated Cr. Patient reports history of CKD but does not know his baseline.  [CS]    Clinical Course User Index [CS] Truddie Hidden, MD     MDM Rules/Calculators/A&P Medical Decision Making Given presenting complaint, I considered that admission might be necessary. After review of results from ED lab and/or imaging studies, admission to the hospital is not indicated at this time.    Problems Addressed: Chronic kidney disease, unspecified CKD stage: chronic illness or injury Hemorrhagic cystitis: acute illness or injury  Amount and/or Complexity of Data Reviewed Labs: ordered. Decision-making details documented in ED Course. Radiology: ordered and independent interpretation performed. Decision-making details documented in ED Course.  Risk Prescription drug management. Decision regarding hospitalization.    Final  Clinical Impression(s) / ED Diagnoses Final diagnoses:  Hemorrhagic cystitis  Chronic kidney disease, unspecified CKD stage    Rx / DC Orders ED Discharge Orders          Ordered    cephALEXin (KEFLEX) 500 MG capsule  2 times daily        09/18/22 0537             Truddie Hidden, MD 09/18/22 [phone]

## 2022-09-20 LAB — URINE CULTURE: Culture: >=100000 — AB

## 2022-09-21 ENCOUNTER — Telehealth (HOSPITAL_BASED_OUTPATIENT_CLINIC_OR_DEPARTMENT_OTHER): Payer: Self-pay | Admitting: *Deleted

## 2022-09-21 NOTE — Telephone Encounter (Signed)
Post ED Visit - Positive Culture Follow-up  Culture report reviewed by antimicrobial stewardship pharmacist: [facility] Team '[]'$  Elenor Quinones, Pharm.D. '[]'$  Heide Guile, Pharm.D., BCPS AQ-ID '[]'$  Parks Neptune, Pharm.D., BCPS '[]'$  Alycia Rossetti, Pharm.D., BCPS '[]'$  George, [place].D., BCPS, AAHIVP '[]'$  Legrand Como, Pharm.D., BCPS, AAHIVP '[]'$  Salome Arnt, PharmD, BCPS '[]'$  Johnnette Gourd, PharmD, BCPS '[]'$  Hughes Better, PharmD, BCPS '[]'$  Leeroy Cha, PharmD '[]'$  Laqueta Linden, PharmD, BCPS '[]'$  Albertina Parr, PharmD  [facility] Team '[]'$  Leodis Sias, PharmD '[]'$  Lindell Spar, PharmD '[]'$  Royetta Asal, PharmD '[]'$  Graylin Shiver, Rph '[]'$  Rema Fendt) Glennon Mac, PharmD '[]'$  Arlyn Dunning, PharmD '[]'$  Netta Cedars, PharmD '[]'$  Dia Sitter, PharmD '[]'$  Leone Haven, PharmD '[]'$  Gretta Arab, PharmD '[]'$  Theodis Shove, PharmD '[]'$  Peggyann Juba, PharmD '[]'$  Reuel Boom, PharmD   Positive urine culture Treated with Cephalexin, organism sensitive to the same and no further patient follow-up is required at this time.   Harlon Flor [facility] 09/21/2022, 3:01 PM
